# Patient Record
Sex: Male | Born: 1960
Health system: Southern US, Community
[De-identification: ages and names within clinical notes are randomized; demographics above are authoritative.]

## PROBLEM LIST (undated history)

## (undated) DIAGNOSIS — L409 Psoriasis, unspecified: Secondary | ICD-10-CM

## (undated) DIAGNOSIS — Z87442 Personal history of urinary calculi: Secondary | ICD-10-CM

## (undated) DIAGNOSIS — N189 Chronic kidney disease, unspecified: Secondary | ICD-10-CM

## (undated) DIAGNOSIS — F419 Anxiety disorder, unspecified: Secondary | ICD-10-CM

## (undated) DIAGNOSIS — C801 Malignant (primary) neoplasm, unspecified: Secondary | ICD-10-CM

## (undated) DIAGNOSIS — E039 Hypothyroidism, unspecified: Secondary | ICD-10-CM

## (undated) DIAGNOSIS — M1990 Unspecified osteoarthritis, unspecified site: Secondary | ICD-10-CM

## (undated) DIAGNOSIS — L4050 Arthropathic psoriasis, unspecified: Secondary | ICD-10-CM

## (undated) DIAGNOSIS — F32A Depression, unspecified: Secondary | ICD-10-CM

## (undated) DIAGNOSIS — I10 Essential (primary) hypertension: Secondary | ICD-10-CM

## (undated) DIAGNOSIS — K219 Gastro-esophageal reflux disease without esophagitis: Secondary | ICD-10-CM

## (undated) HISTORY — PX: POLYPECTOMY: SHX149

## (undated) HISTORY — DX: Chronic kidney disease, unspecified: N18.9

---

## 1989-10-04 HISTORY — PX: HEMORRHOID SURGERY: SHX153

## 2020-03-06 ENCOUNTER — Other Ambulatory Visit: Payer: Self-pay | Admitting: Urology

## 2020-03-06 DIAGNOSIS — D49512 Neoplasm of unspecified behavior of left kidney: Secondary | ICD-10-CM

## 2020-04-21 ENCOUNTER — Other Ambulatory Visit: Payer: Self-pay

## 2020-04-21 ENCOUNTER — Ambulatory Visit
Admission: RE | Admit: 2020-04-21 | Discharge: 2020-04-21 | Disposition: A | Discharge status: Home / Self Care | Payer: Managed Care, Other (non HMO) | Source: Ambulatory Visit | Attending: Urology | Admitting: Urology

## 2020-04-21 DIAGNOSIS — D49512 Neoplasm of unspecified behavior of left kidney: Secondary | ICD-10-CM

## 2020-04-21 MED ORDER — GADOBENATE DIMEGLUMINE 529 MG/ML IV SOLN
14.0000 mL | Freq: Once | INTRAVENOUS | Status: AC | PRN
  Administered 2020-04-21: 14 mL via INTRAVENOUS

## 2020-05-05 ENCOUNTER — Other Ambulatory Visit: Payer: Self-pay | Admitting: Urology

## 2020-06-04 DIAGNOSIS — J45909 Unspecified asthma, uncomplicated: Secondary | ICD-10-CM

## 2020-06-04 HISTORY — DX: Unspecified asthma, uncomplicated: J45.909

## 2020-06-06 NOTE — Patient Instructions (Addendum)
DUE TO COVID-19 ONLY ONE VISITOR IS ALLOWED TO COME WITH YOU AND STAY IN THE WAITING ROOM ONLY DURING PRE OP AND PROCEDURE DAY OF SURGERY. THE 1 VISITOR  MAY VISIT WITH YOU AFTER SURGERY IN YOUR PRIVATE ROOM DURING VISITING HOURS ONLY!  YOU NEED TO HAVE A COVID 19 TEST ON_9/14______ @_1 :30______, THIS TEST MUST BE DONE BEFORE SURGERY,  COVID TESTING SITE [place] [address], IT IS ON THE RIGHT GOING OUT [address] APPROXIMATELY  2 MINUTES PAST [employer] ON THE RIGHT. ONCE YOUR COVID TEST IS COMPLETED,  PLEASE BEGIN THE QUARANTINE INSTRUCTIONS AS OUTLINED IN YOUR HANDOUT.                Bruce Douglas   Your procedure is scheduled on: 06/20/20   Report to [facility] Main  Entrance   Report to admitting at  11:00 AM     Call this number if you have problems the morning of surgery [phone]    Remember: Do not eat food or drink liquids :After Midnight  . BRUSH YOUR TEETH MORNING OF SURGERY AND RINSE YOUR MOUTH OUT, NO CHEWING GUM CANDY OR MINTS.     Take these medicines the morning of surgery with A SIP OF WATER: Synthroid, Prilosec                                 You may not have any metal on your body including              piercings  Do not wear jewelry, lotions, powders or deodorant                      Men may shave face and neck.   Do not bring valuables to the hospital. [place].  Contacts, dentures or bridgework may not be worn into surgery.                  Please read over the following fact sheets you were given: _____________________________________________________________________             [facility] - Preparing for Surgery Before surgery, you can play an important role.   Because skin is not sterile, your skin needs to be as free of germs as possible .  You can reduce the number of germs on your skin by washing with CHG (chlorahexidine gluconate) soap before  surgery.   CHG is an antiseptic cleaner which kills germs and bonds with the skin to continue killing germs even after washing. Please DO NOT use if you have an allergy to CHG or antibacterial soaps.   If your skin becomes reddened/irritated stop using the CHG and inform your nurse when you arrive at Short Stay.   You may shave your face/neck.  Please follow these instructions carefully:  1.  Shower with CHG Soap the night before surgery and the  morning of Surgery.  2.  If you choose to wash your hair, wash your hair first as usual with your  normal  shampoo.  3.  After you shampoo, rinse your hair and body thoroughly to remove the  shampoo.  4.  Use CHG as you would any other liquid soap.  You can apply chg directly  to the skin and wash                       Gently with a scrungie or clean washcloth.  5.  Apply the CHG Soap to your body ONLY FROM THE NECK DOWN.   Do not use on face/ open                           Wound or open sores. Avoid contact with eyes, ears mouth and genitals (private parts).                       Wash face,  Genitals (private parts) with your normal soap.             6.  Wash thoroughly, paying special attention to the area where your surgery  will be performed.  7.  Thoroughly rinse your body with warm water from the neck down.  8.  DO NOT shower/wash with your normal soap after using and rinsing off  the CHG Soap.             9.  Pat yourself dry with a clean towel.            10.  Wear clean pajamas.            11.  Place clean sheets on your bed the night of your first shower and do not  sleep with pets. Day of Surgery : Do not apply any lotions/deodorants the morning of surgery.  Please wear clean clothes to the hospital/surgery center.  FAILURE TO FOLLOW THESE INSTRUCTIONS MAY RESULT IN THE CANCELLATION OF YOUR SURGERY PATIENT SIGNATURE_________________________________  NURSE  SIGNATURE__________________________________  ________________________________________________________________________   Bruce Douglas  An incentive spirometer is a tool that can help keep your lungs clear and active. This tool measures how well you are filling your lungs with each breath. Taking long deep breaths may help reverse or decrease the chance of developing breathing (pulmonary) problems (especially infection) following:  A long period of time when you are unable to move or be active. BEFORE THE PROCEDURE   If the spirometer includes an indicator to show your best effort, your nurse or respiratory therapist will set it to a desired goal.  If possible, sit up straight or lean slightly forward. Try not to slouch.  Hold the incentive spirometer in an upright position. INSTRUCTIONS FOR USE  1. Sit on the edge of your bed if possible, or sit up as far as you can in bed or on a chair. 2. Hold the incentive spirometer in an upright position. 3. Breathe out normally. 4. Place the mouthpiece in your mouth and seal your lips tightly around it. 5. Breathe in slowly and as deeply as possible, raising the piston or the ball toward the top of the column. 6. Hold your breath for 3-5 seconds or for as long as possible. Allow the piston or ball to fall to the bottom of the column. 7. Remove the mouthpiece from your mouth and breathe out normally. 8. Rest for a few seconds and repeat Steps 1 through 7 at least 10 times every 1-2 hours when you are awake. Take your time and take a few normal breaths between deep breaths. 9. The spirometer may include an indicator to show your best effort.  Use the indicator as a goal to work toward during each repetition. 10. After each set of 10 deep breaths, practice coughing to be sure your lungs are clear. If you have an incision (the cut made at the time of surgery), support your incision when coughing by placing a pillow or rolled up towels firmly  against it. Once you are able to get out of bed, walk around indoors and cough well. You may stop using the incentive spirometer when instructed by your caregiver.  RISKS AND COMPLICATIONS  Take your time so you do not get dizzy or light-headed.  If you are in pain, you may need to take or ask for pain medication before doing incentive spirometry. It is harder to take a deep breath if you are having pain. AFTER USE  Rest and breathe slowly and easily.  It can be helpful to keep track of a log of your progress. Your caregiver can provide you with a simple table to help with this. If you are using the spirometer at home, follow these instructions: [place] IF:   You are having difficultly using the spirometer.  You have trouble using the spirometer as often as instructed.  Your pain medication is not giving enough relief while using the spirometer.  You develop fever of 100.5 F (38.1 C) or higher. SEEK IMMEDIATE MEDICAL CARE IF:   You cough up bloody sputum that had not been present before.  You develop fever of 102 F (38.9 C) or greater.  You develop worsening pain at or near the incision site. MAKE SURE YOU:   Understand these instructions.  Will watch your condition.  Will get help right away if you are not doing well or get worse. Document Released: 01/31/2007 Document Revised: 12/13/2011 Document Reviewed: 04/03/2007 [facility] Patient Information 2014 [place], [place].   ________________________________________________________________________

## 2020-06-10 ENCOUNTER — Encounter (HOSPITAL_COMMUNITY): Payer: Self-pay

## 2020-06-10 ENCOUNTER — Other Ambulatory Visit: Payer: Self-pay

## 2020-06-10 ENCOUNTER — Encounter (HOSPITAL_COMMUNITY)
Admission: RE | Admit: 2020-06-10 | Discharge: 2020-06-10 | Disposition: A | Discharge status: Home / Self Care | Payer: 59 | Source: Ambulatory Visit | Attending: Urology | Admitting: Urology

## 2020-06-10 DIAGNOSIS — Z01818 Encounter for other preprocedural examination: Secondary | ICD-10-CM | POA: Insufficient documentation

## 2020-06-10 HISTORY — DX: Anxiety disorder, unspecified: F41.9

## 2020-06-10 HISTORY — DX: Malignant (primary) neoplasm, unspecified: C80.1

## 2020-06-10 HISTORY — DX: Personal history of urinary calculi: Z87.442

## 2020-06-10 HISTORY — DX: Essential (primary) hypertension: I10

## 2020-06-10 HISTORY — DX: Hypothyroidism, unspecified: E03.9

## 2020-06-10 HISTORY — DX: Unspecified osteoarthritis, unspecified site: M19.90

## 2020-06-10 HISTORY — DX: Depression, unspecified: F32.A

## 2020-06-10 HISTORY — DX: Gastro-esophageal reflux disease without esophagitis: K21.9

## 2020-06-10 LAB — COMPREHENSIVE METABOLIC PANEL
ALT: 13 U/L (ref 0–44)
AST: 18 U/L (ref 15–41)
Albumin: 4.1 g/dL (ref 3.5–5.0)
Alkaline Phosphatase: 47 U/L (ref 38–126)
Anion gap: 7 (ref 5–15)
BUN: 22 mg/dL — ABNORMAL HIGH (ref 6–20)
CO2: 25 mmol/L (ref 22–32)
Calcium: 9.4 mg/dL (ref 8.9–10.3)
Chloride: 105 mmol/L (ref 98–111)
Creatinine, Ser: 1.05 mg/dL (ref 0.61–1.24)
GFR calc Af Amer: >60 mL/min (ref >60)
GFR calc non Af Amer: >60 mL/min (ref >60)
Glucose, Bld: 92 mg/dL (ref 70–99)
Potassium: 4.6 mmol/L (ref 3.5–5.1)
Sodium: 137 mmol/L (ref 135–145)
Total Bilirubin: 0.5 mg/dL (ref 0.3–1.2)
Total Protein: 7.5 g/dL (ref 6.5–8.1)

## 2020-06-10 LAB — CBC
HCT: 39.5 % (ref 39.0–52.0)
Hemoglobin: 13.3 g/dL (ref 13.0–17.0)
MCH: 32.6 pg (ref 26.0–34.0)
MCHC: 33.7 g/dL (ref 30.0–36.0)
MCV: 96.8 fL (ref 80.0–100.0)
Platelets: 249 10*3/uL (ref 150–400)
RBC: 4.08 MIL/uL — ABNORMAL LOW (ref 4.22–5.81)
RDW: 12.4 % (ref 11.5–15.5)
WBC: 7.6 10*3/uL (ref 4.0–10.5)
nRBC: 0 % (ref 0.0–0.2)

## 2020-06-10 NOTE — Progress Notes (Signed)
COVID Vaccine Completed:Yes Date COVID Vaccine completed:01/02/20 COVID vaccine manufacturer: Pfizer     PCP - Dr. Margret Chance Cardiologist - no  Chest x-ray - 06/04/20 MRI done EKG - 06/10/20 Stress Test - no ECHO - no Cardiac Cath - no  Sleep Study - yes waiting for results CPAP -   Fasting Blood Sugar - NA Checks Blood Sugar _____ times a day  Blood Thinner Instructions:NA Aspirin Instructions: Last Dose:  Anesthesia review:   Patient denies shortness of breath, fever, cough and chest pain at PAT appointment yes   Patient verbalized understanding of instructions that were given to them at the PAT appointment. Patient was also instructed that they will need to review over the PAT instructions again at home before surgery. Yes  Pt reports no SOB  He has a new diagnosis of Asthma and" they saw something on the x-ray so they did an MRI" He has no SOB climbing stairs while working or with ADLs

## 2020-06-17 ENCOUNTER — Other Ambulatory Visit (HOSPITAL_COMMUNITY)
Admission: RE | Admit: 2020-06-17 | Discharge: 2020-06-17 | Disposition: A | Discharge status: Home / Self Care | Payer: 59 | Source: Ambulatory Visit | Attending: Urology | Admitting: Urology

## 2020-06-17 DIAGNOSIS — Z01812 Encounter for preprocedural laboratory examination: Secondary | ICD-10-CM | POA: Insufficient documentation

## 2020-06-17 DIAGNOSIS — Z20822 Contact with and (suspected) exposure to covid-19: Secondary | ICD-10-CM | POA: Insufficient documentation

## 2020-06-18 LAB — SARS CORONAVIRUS 2 (TAT 6-24 HRS): SARS Coronavirus 2: NEGATIVE

## 2020-06-20 ENCOUNTER — Inpatient Hospital Stay (HOSPITAL_COMMUNITY): Payer: 59 | Admitting: Physician Assistant

## 2020-06-20 ENCOUNTER — Inpatient Hospital Stay (HOSPITAL_COMMUNITY)
Admission: RE | Admit: 2020-06-20 | Discharge: 2020-06-24 | DRG: 657 | Disposition: A | Discharge status: Home / Self Care | Payer: 59 | Attending: Urology | Admitting: Urology

## 2020-06-20 ENCOUNTER — Inpatient Hospital Stay (HOSPITAL_COMMUNITY): Payer: 59 | Admitting: Anesthesiology

## 2020-06-20 ENCOUNTER — Encounter (HOSPITAL_COMMUNITY): Payer: Self-pay | Admitting: Urology

## 2020-06-20 ENCOUNTER — Other Ambulatory Visit: Payer: Self-pay

## 2020-06-20 ENCOUNTER — Encounter (HOSPITAL_COMMUNITY)
Admission: RE | Disposition: A | Discharge status: Home / Self Care | Payer: Self-pay | Source: Ambulatory Visit | Attending: Urology

## 2020-06-20 DIAGNOSIS — I10 Essential (primary) hypertension: Secondary | ICD-10-CM | POA: Diagnosis present

## 2020-06-20 DIAGNOSIS — N2889 Other specified disorders of kidney and ureter: Secondary | ICD-10-CM | POA: Diagnosis present

## 2020-06-20 DIAGNOSIS — K219 Gastro-esophageal reflux disease without esophagitis: Secondary | ICD-10-CM | POA: Diagnosis present

## 2020-06-20 DIAGNOSIS — Y838 Other surgical procedures as the cause of abnormal reaction of the patient, or of later complication, without mention of misadventure at the time of the procedure: Secondary | ICD-10-CM | POA: Diagnosis not present

## 2020-06-20 DIAGNOSIS — M1990 Unspecified osteoarthritis, unspecified site: Secondary | ICD-10-CM | POA: Diagnosis present

## 2020-06-20 DIAGNOSIS — J449 Chronic obstructive pulmonary disease, unspecified: Secondary | ICD-10-CM | POA: Diagnosis present

## 2020-06-20 DIAGNOSIS — Z20822 Contact with and (suspected) exposure to covid-19: Secondary | ICD-10-CM | POA: Diagnosis present

## 2020-06-20 DIAGNOSIS — F419 Anxiety disorder, unspecified: Secondary | ICD-10-CM | POA: Diagnosis present

## 2020-06-20 DIAGNOSIS — E039 Hypothyroidism, unspecified: Secondary | ICD-10-CM | POA: Diagnosis present

## 2020-06-20 DIAGNOSIS — R1110 Vomiting, unspecified: Secondary | ICD-10-CM

## 2020-06-20 DIAGNOSIS — F17210 Nicotine dependence, cigarettes, uncomplicated: Secondary | ICD-10-CM | POA: Diagnosis present

## 2020-06-20 DIAGNOSIS — K567 Ileus, unspecified: Secondary | ICD-10-CM | POA: Diagnosis not present

## 2020-06-20 DIAGNOSIS — R350 Frequency of micturition: Secondary | ICD-10-CM | POA: Diagnosis present

## 2020-06-20 DIAGNOSIS — K9189 Other postprocedural complications and disorders of digestive system: Secondary | ICD-10-CM | POA: Diagnosis not present

## 2020-06-20 DIAGNOSIS — C642 Malignant neoplasm of left kidney, except renal pelvis: Secondary | ICD-10-CM | POA: Diagnosis present

## 2020-06-20 DIAGNOSIS — F329 Major depressive disorder, single episode, unspecified: Secondary | ICD-10-CM | POA: Diagnosis present

## 2020-06-20 HISTORY — PX: ROBOT ASSISTED LAPAROSCOPIC NEPHRECTOMY: SHX5140

## 2020-06-20 LAB — TYPE AND SCREEN
ABO/RH(D): A NEG
Antibody Screen: NEGATIVE

## 2020-06-20 LAB — HEMOGLOBIN AND HEMATOCRIT, BLOOD
HCT: 38 % — ABNORMAL LOW (ref 39.0–52.0)
Hemoglobin: 13.1 g/dL (ref 13.0–17.0)

## 2020-06-20 LAB — ABO/RH: ABO/RH(D): A NEG

## 2020-06-20 SURGERY — NEPHRECTOMY, RADICAL, ROBOT-ASSISTED, LAPAROSCOPIC, ADULT
Anesthesia: General | Site: Abdomen | Laterality: Left

## 2020-06-20 MED ORDER — ORAL CARE MOUTH RINSE: 15.0000 mL | Freq: Once | OROMUCOSAL | Status: AC

## 2020-06-20 MED ORDER — BELLADONNA ALKALOIDS-OPIUM 16.2-60 MG RE SUPP
RECTAL | Status: AC
  Administered 2020-06-20: 1 via RECTAL
  Filled 2020-06-20: qty 1

## 2020-06-20 MED ORDER — HYDROCODONE-ACETAMINOPHEN 5-325 MG PO TABS: 1.0000 | ORAL_TABLET | Freq: Four times a day (QID) | ORAL | 0 refills | Status: DC | PRN

## 2020-06-20 MED ORDER — LIDOCAINE HCL (CARDIAC) PF 100 MG/5ML IV SOSY
PREFILLED_SYRINGE | INTRAVENOUS | Status: DC | PRN
  Administered 2020-06-20: 40 mg via INTRAVENOUS

## 2020-06-20 MED ORDER — HYDROMORPHONE HCL 1 MG/ML IJ SOLN
INTRAMUSCULAR | Status: AC
  Administered 2020-06-20: 0.5 mg via INTRAVENOUS
  Filled 2020-06-20: qty 1

## 2020-06-20 MED ORDER — ROCURONIUM BROMIDE 10 MG/ML (PF) SYRINGE
PREFILLED_SYRINGE | INTRAVENOUS | Status: DC | PRN
  Administered 2020-06-20: 50 mg via INTRAVENOUS
  Administered 2020-06-20: 30 mg via INTRAVENOUS

## 2020-06-20 MED ORDER — CEFAZOLIN SODIUM-DEXTROSE 1-4 GM/50ML-% IV SOLN
1.0000 g | Freq: Three times a day (TID) | INTRAVENOUS | Status: AC
  Administered 2020-06-20 – 2020-06-21 (×2): 1 g via INTRAVENOUS
  Filled 2020-06-20 (×2): qty 50

## 2020-06-20 MED ORDER — SUGAMMADEX SODIUM 200 MG/2ML IV SOLN
INTRAVENOUS | Status: DC | PRN
  Administered 2020-06-20: 140 mg via INTRAVENOUS

## 2020-06-20 MED ORDER — FENTANYL CITRATE (PF) 100 MCG/2ML IJ SOLN
INTRAMUSCULAR | Status: AC
  Administered 2020-06-20: 50 ug via INTRAVENOUS
  Filled 2020-06-20: qty 2

## 2020-06-20 MED ORDER — PROPOFOL 10 MG/ML IV BOLUS
INTRAVENOUS | Status: AC
  Filled 2020-06-20: qty 20

## 2020-06-20 MED ORDER — LIDOCAINE 2% (20 MG/ML) 5 ML SYRINGE
INTRAMUSCULAR | Status: AC
  Filled 2020-06-20: qty 5

## 2020-06-20 MED ORDER — BACITRACIN-NEOMYCIN-POLYMYXIN 400-5-5000 EX OINT: 1.0000 "application " | TOPICAL_OINTMENT | Freq: Three times a day (TID) | CUTANEOUS | Status: DC | PRN

## 2020-06-20 MED ORDER — DEXTROSE-NACL 5-0.45 % IV SOLN: INTRAVENOUS | Status: DC

## 2020-06-20 MED ORDER — PROPOFOL 10 MG/ML IV BOLUS
INTRAVENOUS | Status: DC | PRN
  Administered 2020-06-20: 120 mg via INTRAVENOUS

## 2020-06-20 MED ORDER — DOCUSATE SODIUM 100 MG PO CAPS
100.0000 mg | ORAL_CAPSULE | Freq: Two times a day (BID) | ORAL | Status: DC
  Administered 2020-06-20 – 2020-06-23 (×3): 100 mg via ORAL
  Filled 2020-06-20 (×7): qty 1

## 2020-06-20 MED ORDER — BUPIVACAINE-EPINEPHRINE (PF) 0.25% -1:200000 IJ SOLN
INTRAMUSCULAR | Status: AC
  Filled 2020-06-20: qty 30

## 2020-06-20 MED ORDER — PROMETHAZINE HCL 25 MG/ML IJ SOLN: 6.2500 mg | INTRAMUSCULAR | Status: DC | PRN

## 2020-06-20 MED ORDER — BELLADONNA ALKALOIDS-OPIUM 16.2-60 MG RE SUPP: 1.0000 | Freq: Four times a day (QID) | RECTAL | Status: DC | PRN

## 2020-06-20 MED ORDER — PHENYLEPHRINE 40 MCG/ML (10ML) SYRINGE FOR IV PUSH (FOR BLOOD PRESSURE SUPPORT)
PREFILLED_SYRINGE | INTRAVENOUS | Status: DC | PRN
  Administered 2020-06-20 (×6): 80 ug via INTRAVENOUS

## 2020-06-20 MED ORDER — FENTANYL CITRATE (PF) 250 MCG/5ML IJ SOLN
INTRAMUSCULAR | Status: DC | PRN
Start: 2020-06-20 — End: 2020-06-20
  Administered 2020-06-20: 250 ug via INTRAVENOUS
  Administered 2020-06-20 (×2): 50 ug via INTRAVENOUS
  Administered 2020-06-20: 100 ug via INTRAVENOUS

## 2020-06-20 MED ORDER — FENTANYL CITRATE (PF) 100 MCG/2ML IJ SOLN
INTRAMUSCULAR | Status: AC
  Filled 2020-06-20: qty 2

## 2020-06-20 MED ORDER — VARENICLINE TARTRATE 1 MG PO TABS
1.0000 mg | ORAL_TABLET | Freq: Two times a day (BID) | ORAL | Status: DC
  Administered 2020-06-20 – 2020-06-21 (×2): 1 mg via ORAL
  Filled 2020-06-20 (×3): qty 2
  Filled 2020-06-20 (×2): qty 1
  Filled 2020-06-20: qty 2
  Filled 2020-06-20 (×2): qty 1

## 2020-06-20 MED ORDER — PHENYLEPHRINE 40 MCG/ML (10ML) SYRINGE FOR IV PUSH (FOR BLOOD PRESSURE SUPPORT)
PREFILLED_SYRINGE | INTRAVENOUS | Status: AC
  Filled 2020-06-20: qty 10

## 2020-06-20 MED ORDER — CEFAZOLIN SODIUM-DEXTROSE 2-4 GM/100ML-% IV SOLN
2.0000 g | Freq: Once | INTRAVENOUS | Status: AC
  Administered 2020-06-20: 2 g via INTRAVENOUS
  Filled 2020-06-20: qty 100

## 2020-06-20 MED ORDER — BUPROPION HCL ER (XL) 300 MG PO TB24
300.0000 mg | ORAL_TABLET | Freq: Every day | ORAL | Status: DC
  Administered 2020-06-20 – 2020-06-23 (×3): 300 mg via ORAL
  Filled 2020-06-20 (×4): qty 1

## 2020-06-20 MED ORDER — ACETAMINOPHEN 500 MG PO TABS
1000.0000 mg | ORAL_TABLET | Freq: Once | ORAL | Status: AC
  Administered 2020-06-20: 1000 mg via ORAL
  Filled 2020-06-20: qty 2

## 2020-06-20 MED ORDER — AMLODIPINE BESYLATE 5 MG PO TABS
5.0000 mg | ORAL_TABLET | Freq: Every day | ORAL | Status: DC
  Administered 2020-06-20 – 2020-06-21 (×2): 5 mg via ORAL
  Filled 2020-06-20 (×3): qty 1

## 2020-06-20 MED ORDER — BUPIVACAINE-EPINEPHRINE 0.25% -1:200000 IJ SOLN
INTRAMUSCULAR | Status: DC | PRN
  Administered 2020-06-20: 30 mL

## 2020-06-20 MED ORDER — DOCUSATE SODIUM 100 MG PO CAPS: 100.0000 mg | ORAL_CAPSULE | Freq: Two times a day (BID) | ORAL | Status: DC

## 2020-06-20 MED ORDER — LEVOTHYROXINE SODIUM 137 MCG PO TABS
137.0000 ug | ORAL_TABLET | Freq: Every day | ORAL | Status: DC
  Administered 2020-06-21 – 2020-06-22 (×2): 137 ug via ORAL
  Filled 2020-06-20 (×3): qty 1

## 2020-06-20 MED ORDER — CHLORHEXIDINE GLUCONATE 0.12 % MT SOLN
15.0000 mL | Freq: Once | OROMUCOSAL | Status: AC
  Administered 2020-06-20: 15 mL via OROMUCOSAL

## 2020-06-20 MED ORDER — PANTOPRAZOLE SODIUM 40 MG PO TBEC
40.0000 mg | DELAYED_RELEASE_TABLET | Freq: Every day | ORAL | Status: DC
  Administered 2020-06-20 – 2020-06-21 (×2): 40 mg via ORAL
  Filled 2020-06-20 (×4): qty 1

## 2020-06-20 MED ORDER — FENTANYL CITRATE (PF) 250 MCG/5ML IJ SOLN
INTRAMUSCULAR | Status: AC
  Filled 2020-06-20: qty 5

## 2020-06-20 MED ORDER — HYDROMORPHONE HCL 1 MG/ML IJ SOLN
0.2500 mg | INTRAMUSCULAR | Status: DC | PRN
  Administered 2020-06-20: 0.5 mg via INTRAVENOUS

## 2020-06-20 MED ORDER — QUETIAPINE FUMARATE 50 MG PO TABS
50.0000 mg | ORAL_TABLET | Freq: Every day | ORAL | Status: DC
  Administered 2020-06-20 – 2020-06-23 (×3): 50 mg via ORAL
  Filled 2020-06-20 (×2): qty 1
  Filled 2020-06-20: qty 2
  Filled 2020-06-20 (×3): qty 1
  Filled 2020-06-20 (×2): qty 2

## 2020-06-20 MED ORDER — PROMETHAZINE HCL 12.5 MG PO TABS: 12.5000 mg | ORAL_TABLET | ORAL | 0 refills | Status: DC | PRN

## 2020-06-20 MED ORDER — FENTANYL CITRATE (PF) 100 MCG/2ML IJ SOLN: 25.0000 ug | INTRAMUSCULAR | Status: DC | PRN

## 2020-06-20 MED ORDER — LACTATED RINGERS IV SOLN: INTRAVENOUS | Status: DC

## 2020-06-20 MED ORDER — BUPIVACAINE LIPOSOME 1.3 % IJ SUSP
20.0000 mL | Freq: Once | INTRAMUSCULAR | Status: AC
  Administered 2020-06-20: 20 mL
  Filled 2020-06-20: qty 20

## 2020-06-20 MED ORDER — LACTATED RINGERS IR SOLN
Status: DC | PRN
  Administered 2020-06-20: 1000 mL

## 2020-06-20 MED ORDER — ROCURONIUM BROMIDE 10 MG/ML (PF) SYRINGE
PREFILLED_SYRINGE | INTRAVENOUS | Status: AC
  Filled 2020-06-20: qty 10

## 2020-06-20 MED ORDER — DIPHENHYDRAMINE HCL 50 MG/ML IJ SOLN: 12.5000 mg | Freq: Four times a day (QID) | INTRAMUSCULAR | Status: DC | PRN

## 2020-06-20 MED ORDER — DEXMEDETOMIDINE HCL IN NACL 200 MCG/50ML IV SOLN
INTRAVENOUS | Status: DC | PRN
  Administered 2020-06-20: 20 ug via INTRAVENOUS

## 2020-06-20 MED ORDER — DIPHENHYDRAMINE HCL 12.5 MG/5ML PO ELIX: 12.5000 mg | ORAL_SOLUTION | Freq: Four times a day (QID) | ORAL | Status: DC | PRN

## 2020-06-20 MED ORDER — HYDROMORPHONE HCL 1 MG/ML IJ SOLN
0.5000 mg | INTRAMUSCULAR | Status: DC | PRN
  Administered 2020-06-20 – 2020-06-22 (×8): 1 mg via INTRAVENOUS
  Administered 2020-06-23: 0.5 mg via INTRAVENOUS
  Administered 2020-06-23: 1 mg via INTRAVENOUS
  Administered 2020-06-23 (×2): 0.5 mg via INTRAVENOUS
  Administered 2020-06-24: 1 mg via INTRAVENOUS
  Filled 2020-06-20 (×13): qty 1

## 2020-06-20 MED ORDER — SODIUM CHLORIDE (PF) 0.9 % IJ SOLN
INTRAMUSCULAR | Status: AC
  Filled 2020-06-20: qty 20

## 2020-06-20 MED ORDER — MIDAZOLAM HCL 2 MG/2ML IJ SOLN
INTRAMUSCULAR | Status: AC
  Filled 2020-06-20: qty 2

## 2020-06-20 MED ORDER — PROMETHAZINE HCL 25 MG/ML IJ SOLN
25.0000 mg | INTRAMUSCULAR | Status: DC | PRN
  Administered 2020-06-20: 25 mg via INTRAVENOUS
  Filled 2020-06-20: qty 1

## 2020-06-20 MED ORDER — ATENOLOL 25 MG PO TABS
25.0000 mg | ORAL_TABLET | Freq: Every day | ORAL | Status: DC
  Administered 2020-06-20 – 2020-06-21 (×2): 25 mg via ORAL
  Filled 2020-06-20 (×3): qty 1

## 2020-06-20 MED ORDER — ONDANSETRON HCL 4 MG/2ML IJ SOLN
INTRAMUSCULAR | Status: DC | PRN
  Administered 2020-06-20: 4 mg via INTRAVENOUS

## 2020-06-20 MED ORDER — DEXMEDETOMIDINE HCL IN NACL 200 MCG/50ML IV SOLN
INTRAVENOUS | Status: AC
  Filled 2020-06-20: qty 50

## 2020-06-20 MED ORDER — MIDAZOLAM HCL 2 MG/2ML IJ SOLN
INTRAMUSCULAR | Status: DC | PRN
  Administered 2020-06-20: 2 mg via INTRAVENOUS

## 2020-06-20 MED ORDER — DEXAMETHASONE SODIUM PHOSPHATE 10 MG/ML IJ SOLN
INTRAMUSCULAR | Status: DC | PRN
  Administered 2020-06-20: 5 mg via INTRAVENOUS

## 2020-06-20 MED ORDER — STERILE WATER FOR IRRIGATION IR SOLN
Status: DC | PRN
  Administered 2020-06-20: 1000 mL

## 2020-06-20 MED ORDER — OXYCODONE HCL 5 MG PO TABS
5.0000 mg | ORAL_TABLET | ORAL | Status: DC | PRN
  Administered 2020-06-20 – 2020-06-24 (×7): 5 mg via ORAL
  Filled 2020-06-20 (×8): qty 1

## 2020-06-20 MED ORDER — ACETAMINOPHEN 325 MG PO TABS: 650.0000 mg | ORAL_TABLET | ORAL | Status: DC | PRN

## 2020-06-20 MED ORDER — ONDANSETRON HCL 4 MG/2ML IJ SOLN
4.0000 mg | INTRAMUSCULAR | Status: DC | PRN
  Administered 2020-06-20 – 2020-06-23 (×3): 4 mg via INTRAVENOUS
  Filled 2020-06-20 (×3): qty 2

## 2020-06-20 MED ORDER — DOXAZOSIN MESYLATE 4 MG PO TABS
4.0000 mg | ORAL_TABLET | Freq: Every day | ORAL | Status: DC
  Administered 2020-06-20 – 2020-06-21 (×3): 4 mg via ORAL
  Filled 2020-06-20 (×2): qty 1
  Filled 2020-06-20: qty 2
  Filled 2020-06-20: qty 1
  Filled 2020-06-20 (×2): qty 2

## 2020-06-20 MED FILL — PROMETHAZINE 12.5 MG TABLET: 12.5 | 2 days supply | Qty: 15 | Fill #0

## 2020-06-20 MED FILL — HYDROCODON-APAP 5-325: 5-325 | 3 days supply | Qty: 20 | Fill #0

## 2020-06-20 SURGICAL SUPPLY — 55 items
ADH SKN CLS APL DERMABOND .7 (GAUZE/BANDAGES/DRESSINGS) ×1
APL PRP STRL LF DISP 70% ISPRP (MISCELLANEOUS) ×1
BAG LAPAROSCOPIC 12 15 PORT 16 (BASKET) ×1 IMPLANT
BAG RETRIEVAL 12/15 (BASKET) ×2
BAG RETRIEVAL 12/15MM (BASKET) ×1
CHLORAPREP W/TINT 26 (MISCELLANEOUS) ×3 IMPLANT
CLIP VESOLOCK LG 6/CT PURPLE (CLIP) ×3 IMPLANT
CLIP VESOLOCK MED LG 6/CT (CLIP) ×3 IMPLANT
CLIP VESOLOCK XL 6/CT (CLIP) ×3 IMPLANT
COVER SURGICAL LIGHT HANDLE (MISCELLANEOUS) ×3 IMPLANT
COVER TIP SHEARS 8 DVNC (MISCELLANEOUS) ×1 IMPLANT
COVER TIP SHEARS 8MM DA VINCI (MISCELLANEOUS) ×2
COVER WAND RF STERILE (DRAPES) IMPLANT
CUTTER ECHEON FLEX ENDO 45 340 (ENDOMECHANICALS) IMPLANT
DECANTER SPIKE VIAL GLASS SM (MISCELLANEOUS) ×3 IMPLANT
DERMABOND ADVANCED (GAUZE/BANDAGES/DRESSINGS) ×2
DERMABOND ADVANCED .7 DNX12 (GAUZE/BANDAGES/DRESSINGS) ×1 IMPLANT
DRAPE ARM DVNC X/XI (DISPOSABLE) ×4 IMPLANT
DRAPE COLUMN DVNC XI (DISPOSABLE) ×1 IMPLANT
DRAPE DA VINCI XI ARM (DISPOSABLE) ×8
DRAPE DA VINCI XI COLUMN (DISPOSABLE) ×2
DRAPE INCISE IOBAN 66X45 STRL (DRAPES) ×3 IMPLANT
DRAPE SHEET LG 3/4 BI-LAMINATE (DRAPES) ×3 IMPLANT
ELECT REM PT RETURN 15FT ADLT (MISCELLANEOUS) ×3 IMPLANT
GLOVE BIO SURGEON STRL SZ 6.5 (GLOVE) ×2 IMPLANT
GLOVE BIO SURGEONS STRL SZ 6.5 (GLOVE) ×1
GLOVE BIOGEL M STRL SZ7.5 (GLOVE) ×6 IMPLANT
GOWN STRL REUS W/TWL LRG LVL3 (GOWN DISPOSABLE) ×3 IMPLANT
GOWN STRL REUS W/TWL XL LVL3 (GOWN DISPOSABLE) ×6 IMPLANT
HEMOSTAT SURGICEL 4X8 (HEMOSTASIS) ×3 IMPLANT
IRRIG SUCT STRYKERFLOW 2 WTIP (MISCELLANEOUS) ×3
IRRIGATION SUCT STRKRFLW 2 WTP (MISCELLANEOUS) ×1 IMPLANT
KIT BASIN OR (CUSTOM PROCEDURE TRAY) ×3 IMPLANT
KIT TURNOVER KIT A (KITS) IMPLANT
NEEDLE INSUFFLATION 14GA 120MM (NEEDLE) ×3 IMPLANT
PENCIL SMOKE EVACUATOR (MISCELLANEOUS) IMPLANT
PROTECTOR NERVE ULNAR (MISCELLANEOUS) ×3 IMPLANT
SEAL CANN UNIV 5-8 DVNC XI (MISCELLANEOUS) ×3 IMPLANT
SEAL XI 5MM-8MM UNIVERSAL (MISCELLANEOUS) ×6
SET TUBE SMOKE EVAC HIGH FLOW (TUBING) ×3 IMPLANT
SOLUTION ELECTROLUBE (MISCELLANEOUS) ×3 IMPLANT
STAPLE RELOAD 45 WHT (STAPLE) ×2 IMPLANT
STAPLE RELOAD 45MM WHITE (STAPLE) ×6
SUT MNCRL AB 4-0 PS2 18 (SUTURE) ×6 IMPLANT
SUT PDS AB 0 CT1 36 (SUTURE) ×6 IMPLANT
SUT VIC AB 2-0 SH 27 (SUTURE) ×3
SUT VIC AB 2-0 SH 27X BRD (SUTURE) ×1 IMPLANT
SUT VICRYL 0 UR6 27IN ABS (SUTURE) ×3 IMPLANT
TOWEL OR 17X26 10 PK STRL BLUE (TOWEL DISPOSABLE) ×3 IMPLANT
TOWEL OR NON WOVEN STRL DISP B (DISPOSABLE) ×3 IMPLANT
TRAY FOLEY MTR SLVR 16FR STAT (SET/KITS/TRAYS/PACK) ×3 IMPLANT
TRAY LAPAROSCOPIC (CUSTOM PROCEDURE TRAY) ×3 IMPLANT
TROCAR BLADELESS OPT 5 100 (ENDOMECHANICALS) IMPLANT
TROCAR XCEL 12X100 BLDLESS (ENDOMECHANICALS) ×3 IMPLANT
WATER STERILE IRR 1000ML POUR (IV SOLUTION) ×3 IMPLANT

## 2020-06-20 NOTE — Discharge Instructions (Signed)

## 2020-06-20 NOTE — Op Note (Signed)
Operative Note  Preoperative diagnosis:  1.  Left renal mass   Postoperative diagnosis: 1.  Left renal mass  Procedure(s): 1.  Left robot assisted radical nephrectomy (adrenal sparing)  Surgeon: Ellison Hughs, MD  Assistants:  Debbrah Alar, [facility]  An assistant was required for this surgical procedure.  The duties of the assistant included but were not limited to suctioning, passing suture, camera manipulation, retraction.  This procedure would not be able to be performed without an Environmental consultant.   Anesthesia:  General  Complications:  None  EBL:  50 mL  Specimens: 1. Left kidney  Drains/Catheters: 1.  Foley catheter  Intraoperative findings:   1. The left renal hilum was hemostatic following staple ligation  Indication:  Bruce Douglas is a 59 y.o. male with a 2.1 cm endophytic, solid and cystic lesion involving the left kidney with features concerning for RCC on CT and MRI.  He has been consented for the above procedures, voices understanding and wishes to proceed.   Description of procedure:  After informed consent was obtained, the patient was brought to the operating room and general endotracheal anesthesia was administered.  The patient was then placed in the right lateral decubitus position and prepped and draped in usual sterile fashion.  A timeout was performed.  An 8 mm incision was then made lateral to the left rectus muscle at the level of the left 12th rib.  Abdominal access was obtained via a Veress needle.  The abdominal cavity was then insufflated up to 15 mmHg.  An 8 mm port was then introduced into the abdominal cavity.  Inspection of the port entry site by the robotic camera revealed no adjacent organ injury.  We then placed 2 additional 8 mm robotic ports to triangulate the left renal hilum.  A 12 mm assistant port was then placed between the carmera port and 3rd robotic arm.  The white line of Toldt along the descending colon was incised sharply and the colon,  along with its mesocolonic fat, was reflected medially until the aorta was identified.  We then made a small window adjacent to the lower pole of the left kidney, identifying the left psoas muscle, left ureter and left gonadal vein.  The left ureter and gonadal vein were then reflected anteriorly allowing [place] to then incised the perihilar attachments using electrocautery.  We encountered a small lumbar vein adjacent to the insertion of the left gonadal vein into the left renal vein.  This lumbar vein was ligated with hemo-lock clips in 2 places and incised sharply.  This provided [place] excellent exposure to the left renal hilum.    A 45 mm endovascular stapler was then used to ligate the left renal artery and then the left renal vein, achieving excellent hemostasis.  The remaining peri-renal attachments were then excised using a combination of blunt dissection and electrocautery.  The left adrenal gland was spared.  The endovascular stapler was then used to ligate the left gonadal vein and left ureter.  Once the kidney was freely mobile, it was placed in Endo Catch bag to be be retrieved at the conclusion of the case.  The robot was then de-docked.  A left lower quadrant Gibson incision was then made and the mass was removed within the Endo Catch bag.  The fascia within the midline assistant port was then closed using an interrupted 0 Vicryl suture.  The fascia of the internal and external oblique was then closed using a 0 PDS suture in a running fashion.  The subcutaneous tissue within the [facility] incision was then closed using a running 0 Vicryl suture.  All skin incisions were then closed using 4-0 Monocryl and then dressed with Dermabond.  The patient tolerated the procedure well and was transferred to the postanesthesia in stable condition.     Plan:  Monitor on the floor overnight.  Repeat labs and remove foley in the AM.  Advance diet as tolerated.

## 2020-06-20 NOTE — Anesthesia Postprocedure Evaluation (Signed)
Anesthesia Post Note  Patient: Bruce Douglas  Procedure(s) Performed: XI ROBOTIC ASSISTED LAPAROSCOPIC RADICAL NEPHRECTOMY (Left Abdomen)     Patient location during evaluation: PACU Anesthesia Type: General Level of consciousness: awake and alert, patient cooperative and oriented Pain management: pain level controlled (pain improving) Vital Signs Assessment: post-procedure vital signs reviewed and stable Respiratory status: spontaneous breathing, nonlabored ventilation and respiratory function stable Cardiovascular status: blood pressure returned to baseline and stable Postop Assessment: no apparent nausea or vomiting Anesthetic complications: no   No complications documented.  Last Vitals:  Vitals:   06/20/20 1545 06/20/20 1600  BP: 112/70 120/74  Pulse: 70 68  Resp: 11 12  Temp:    SpO2: 98% 95%    Last Pain:  Vitals:   06/20/20 1536  TempSrc:   PainSc: 6                  Camy Leder,E. Jadin Kagel

## 2020-06-20 NOTE — Anesthesia Procedure Notes (Signed)
Procedure Name: Intubation Date/Time: 06/20/2020 12:29 PM Performed by: Raenette Rover, CRNA Pre-anesthesia Checklist: Patient identified, Emergency Drugs available, Suction available and Patient being monitored Patient Re-evaluated:Patient Re-evaluated prior to induction Oxygen Delivery Method: Circle system utilized Preoxygenation: Pre-oxygenation with 100% oxygen Induction Type: IV induction Ventilation: Mask ventilation without difficulty Laryngoscope Size: Mac and 4 Grade View: Grade I Tube type: Oral Tube size: 7.5 mm Number of attempts: 1 Airway Equipment and Method: Stylet Placement Confirmation: ETT inserted through vocal cords under direct vision,  positive ETCO2 and breath sounds checked- equal and bilateral Secured at: 21 cm Tube secured with: Tape Dental Injury: Teeth and Oropharynx as per pre-operative assessment

## 2020-06-20 NOTE — H&P (Signed)
PRE-OP H&P  Office Visit Report     06/10/2020   --------------------------------------------------------------------------------   Bruce Douglas  MRN: [id number]  DOB: 15-Jun-1961, 59 year old Male  PRIMARY CARE:  Serita Grammes, MD  REFERRING:  Serita Grammes, MD  PROVIDER:  Ellison Hughs, M.D.  TREATING:  Jiles Crocker, NP  LOCATION:  [facility] [phone]     --------------------------------------------------------------------------------   CC/HPI: Left renal mass   HPI: Bruce Douglas is a 59 year old male with a 1.8 cm cystic left renal lesion with features concerning for RCC. The lesion was initially identified on CT from 10/26/18 during an evaluation for abdominal pain and was initially classified as a Bosniak 24F cyst. Subsequent surveillance MRI on 10/17/19 showed 2 new mural nodules within the cystic lesion that were not present on MRI from 11/2018.   04/28/20: The patient is here today for a routine follow-up. His surveillance MRI shows slightly interval enlargement of the complex, endophytic cystic lesion involving the left kidney. Benign, Bosniak 1 right renal cysts noted. He denies interval episodes of flank pain or hematuria. He reports that he has a torn right rotator cuff and is planning on having it addressed after his nephrectomy.   06/10/2020: Here for preoperative appointment prior to undergoing left robotic nephrectomy with his urologist on 09/17.   Since last office visit, the patient is taking Celebrex daily as well as Tylenol to help manage joint pain related to his torn right rotator cuff. He has follow-up with orthopedics prior to procedure but any plan intervention will be deferred until after his nephrectomy. Patient also taking albuterol daily as needed for respiratory issues related to underlying asthma disease. He is on a daily steroid inhaler as well. He denies other changes to past medical history, no interval infection treatment, no  interval surgical procedure. Denies interval fevers or chills, nausea/vomiting. Patient at baseline does have some obstructive voiding symptoms with intermittent straining and hesitancy as well as intermittently bothersome daytime frequency/urgency as well as nocturia. He mentions to me wanting to pursue further evaluation/treatment of this once he is recovered from his nephrectomy. Denies any interval burning or painful urination, visible blood in the urine.     ALLERGIES: None   MEDICATIONS: Levothyroxine Sodium 100 mcg tablet  Omeprazole 20 mg tablet, delayed release  Amlodipine Besylate 5 mg tablet  Atenolol 25 mg tablet  Bupropion Xl 300 mg tablet, extended release 24 hr  Celebrex  Cyclobenzaprine Hcl 10 mg tablet  Doxazosin Mesylate 4 mg tablet  Quetiapine Fumarate 50 mg tablet  Tylenol     GU PSH: None   NON-GU PSH: Hemorrhoidectomy     GU PMH: Left renal neoplasm - 10/30/2019    NON-GU PMH: Anxiety Depression Hypertension Hypothyroidism    FAMILY HISTORY: Cancer - Runs in Family   SOCIAL HISTORY: Marital Status: Married Preferred Language: English; Race: White Current Smoking Status: Patient smokes. Smokes 1 pack per day.   Tobacco Use Assessment Completed: Used Tobacco in last 30 days? Has never drank.  Drinks 4+ caffeinated drinks per day.    REVIEW OF SYSTEMS:    GU Review Male:   Patient reports frequent urination, hard to postpone urination, get up at night to urinate, trouble starting your stream, and have to strain to urinate . Patient denies burning/ pain with urination, leakage of urine, stream starts and stops, erection problems, and penile pain.  Gastrointestinal (Upper):   Patient denies nausea, vomiting, and indigestion/ heartburn.  Gastrointestinal (Lower):  Patient denies diarrhea and constipation.  Constitutional:   Patient denies fever, night sweats, weight loss, and fatigue.  Skin:   Patient denies skin rash/ lesion and itching.  Eyes:    Patient denies blurred vision and double vision.  Ears/ Nose/ Throat:   Patient denies sinus problems and sore throat.  Hematologic/Lymphatic:   Patient denies swollen glands and easy bruising.  Cardiovascular:   Patient denies leg swelling and chest pains.  Respiratory:   Patient denies cough and shortness of breath.  Endocrine:   Patient denies excessive thirst.  Musculoskeletal:   Patient reports back pain and joint pain.   Neurological:   Patient reports headaches. Patient denies dizziness.  Psychologic:   Patient denies depression and anxiety.   Notes: Updated from previous visit 04/28/2020 with review from patient as noted above.   VITAL SIGNS:      06/10/2020 10:47 AM  Weight 150 lb / 68.04 kg  Height 70 in / 177.8 cm  BP 113/75 mmHg  Pulse 69 /min  Temperature 97.3 F / 36.2 C  BMI 21.5 kg/m   MULTI-SYSTEM PHYSICAL EXAMINATION:    Constitutional: Well-nourished. No physical deformities. Normally developed. Good grooming.  Neck: Neck symmetrical, not swollen. Normal tracheal position.  Respiratory: No labored breathing, no use of accessory muscles.   Cardiovascular: Normal temperature, normal extremity pulses, no swelling, no varicosities.  Lymphatic: No enlargement of neck, axillae, groin.  Neurologic / Psychiatric: Oriented to time, oriented to place, oriented to person. No depression, no anxiety, no agitation.  Gastrointestinal: No mass, no tenderness, no rigidity, non obese abdomen.  Musculoskeletal: Normal gait and station of head and neck.     Complexity of Data:  Source Of History:  Patient, Medical Record Summary  Records Review:   Previous Doctor Records, Previous Hospital Records, Previous Patient Records  Urine Test Review:   Urinalysis  X-Ray Review: MRI Abdomen: Reviewed Report.     06/10/20  Urinalysis  Urine Appearance Clear   Urine Color Yellow   Urine Glucose Neg mg/dL  Urine Bilirubin Neg mg/dL  Urine Ketones Neg mg/dL  Urine Specific Gravity  1.020   Urine Blood 1+ ery/uL  Urine pH <=5.0   Urine Protein Neg mg/dL  Urine Urobilinogen 0.2 mg/dL  Urine Nitrites Neg   Urine Leukocyte Esterase Neg leu/uL  Urine WBC/hpf NS (Not Seen)   Urine RBC/hpf 0 - 2/hpf   Urine Epithelial Cells NS (Not Seen)   Urine Bacteria Rare (0-9/hpf)   Urine Mucous Present   Urine Yeast NS (Not Seen)   Urine Trichomonas Not Present   Urine Cystals NS (Not Seen)   Urine Casts NS (Not Seen)   Urine Sperm Not Present   Notes:                     CLINICAL DATA: Follow-up complex left renal cyst   EXAM:  MRI ABDOMEN WITHOUT AND WITH CONTRAST   TECHNIQUE:  Multiplanar multisequence MR imaging of the abdomen was performed  both before and after the administration of intravenous contrast.   CONTRAST: 49mL MULTIHANCE GADOBENATE DIMEGLUMINE 529 MG/ML IV SOLN   COMPARISON: MR abdomen, 10/17/2019, 11/07/2018   FINDINGS:  Lower chest: No acute findings.   Hepatobiliary: No mass or other parenchymal abnormality identified.   Pancreas: No mass, inflammatory changes, or other parenchymal  abnormality identified.   Spleen: Within normal limits in size and appearance.   Adrenals/Urinary Tract: There is a redemonstrated complex solid and  cystic lesion of the  inferior pole of the left kidney. Internal  contrast enhancing nodular components have significantly increased  in size compared to prior examination dated 10/17/2019, now  measuring in aggregate approximately 1.7 x 1.1 cm (series 12, image  44). Overall lesion dimensions are approximately 2.1 x 1.8 cm, not  significantly changed compared to prior examination when measured  similarly (series 9, image 19). No evidence of hydronephrosis.   Stomach/Bowel: Visualized portions within the abdomen are  unremarkable.   Vascular/Lymphatic: No pathologically enlarged lymph nodes  identified. No abdominal aortic aneurysm demonstrated.   Other: None.   Musculoskeletal: No suspicious bone lesions  identified.   IMPRESSION:  1. There is a redemonstrated complex solid and cystic lesion of the  inferior pole of the left kidney. Internal contrast enhancing  nodular components have significantly increased in size compared to  prior examination when measured similarly, now measuring in  aggregate approximately 1.7 x 1.1 cm. Findings are consistent with  enlarging renal cell carcinoma.  2. No evidence of metastatic disease in the abdomen. No evidence of  renal vein invasion or lymphadenopathy.    Electronically Signed  By: Eddie Candle M.D.  On: 04/22/2020 11:22     PROCEDURES:          Urinalysis w/Scope Dipstick Dipstick Cont'd Micro  Color: Yellow Bilirubin: Neg mg/dL WBC/hpf: NS (Not Seen)  Appearance: Clear Ketones: Neg mg/dL RBC/hpf: 0 - 2/hpf  Specific Gravity: 1.020 Blood: 1+ ery/uL Bacteria: Rare (0-9/hpf)  pH: <=5.0 Protein: Neg mg/dL Cystals: NS (Not Seen)  Glucose: Neg mg/dL Urobilinogen: 0.2 mg/dL Casts: NS (Not Seen)    Nitrites: Neg Trichomonas: Not Present    Leukocyte Esterase: Neg leu/uL Mucous: Present      Epithelial Cells: NS (Not Seen)      Yeast: NS (Not Seen)      Sperm: Not Present    ASSESSMENT:      ICD-10 Details  1 GU:   Left renal neoplasm - D49.512 Left, Chronic, Worsening  2 NON-GU:   Pyuria/other UA findings - R82.79 Minor   PLAN:           Orders Labs Urine Culture          Schedule Return Visit/Planned Activity: Keep Scheduled Appointment - Schedule Surgery, Follow up MD          Document Letter(s):  Created for Patient: Clinical Summary         Notes:   -I reviewed imaging results and films with the patient personally. We discussed that the mass in question has features concerning for malignancy. I explained the natural history of presumed renal cell carcinoma. I reviewed the AUA guidelines for evaluation and treatment of renal masses. The options of active surveillance, in situ tumor ablation, partial and radical nephrectomy was  discussed. The risks of robot-assisted laparoscopic LEFT radical nephrectomy were discussed in detail including but not limited to: negative pathology, open conversion, infection of the skin/abdominal cavity, VTE, MI/CVA, lymphatic leak, injury to adjacent solid/hollow viscus organs, bleeding requiring a blood transfusion, catastrophic bleeding, hernia formation and other imponderables. The patient voices understanding and wishes to proceed.

## 2020-06-20 NOTE — Anesthesia Preprocedure Evaluation (Addendum)
Anesthesia Evaluation  Patient identified by MRN, date of birth, ID band Patient awake    Reviewed: Allergy & Precautions, NPO status , Patient's Chart, lab work & pertinent test results  History of Anesthesia Complications Negative for: history of anesthetic complications  Airway Mallampati: I  TM Distance: >3 FB Neck ROM: Full    Dental  (+) Edentulous Upper, Edentulous Lower   Pulmonary asthma , COPD (does not use inhalers), Current Smoker and Patient abstained from smoking.,  06/17/2020 SARS coronavirus NEG   breath sounds clear to auscultation       Cardiovascular hypertension, Pt. on medications and Pt. on home beta blockers (-) angina Rhythm:Regular Rate:Normal     Neuro/Psych PSYCHIATRIC DISORDERS Anxiety Depression negative neurological ROS     GI/Hepatic GERD  Medicated and Controlled,H/O alcohol and substance abuse, no use in over 10 years   Endo/Other  Hypothyroidism   Renal/GU Renal Mass     Musculoskeletal  (+) Arthritis ,   Abdominal   Peds  Hematology negative hematology ROS (+)   Anesthesia Other Findings   Reproductive/Obstetrics                           Anesthesia Physical Anesthesia Plan  ASA: III  Anesthesia Plan: General   Post-op Pain Management:    Induction: Intravenous  PONV Risk Score and Plan: 3 and Ondansetron, Dexamethasone and Midazolam  Airway Management Planned: Oral ETT  Additional Equipment: None  Intra-op Plan:   Post-operative Plan: Extubation in OR  Informed Consent: I have reviewed the patients History and Physical, chart, labs and discussed the procedure including the risks, benefits and alternatives for the proposed anesthesia with the patient or authorized representative who has indicated his/her understanding and acceptance.       Plan Discussed with: Anesthesiologist, CRNA and Surgeon  Anesthesia Plan Comments:        Anesthesia Quick Evaluation

## 2020-06-20 NOTE — Transfer of Care (Signed)
Immediate Anesthesia Transfer of Care Note  Patient: Bruce Douglas  Procedure(s) Performed: XI ROBOTIC ASSISTED LAPAROSCOPIC RADICAL NEPHRECTOMY (Left Abdomen)  Patient Location: PACU  Anesthesia Type:General  Level of Consciousness: drowsy  Airway & Oxygen Therapy: Patient Spontanous Breathing and Patient connected to face mask oxygen  Post-op Assessment: Report given to RN and Post -op Vital signs reviewed and stable  Post vital signs: Reviewed and stable  Last Vitals:  Vitals Value Taken Time  BP 134/82 06/20/20 1430  Temp    Pulse 68 06/20/20 1439  Resp 8 06/20/20 1439  SpO2 96 % 06/20/20 1439  Vitals shown include unvalidated device data.  Last Pain:  Vitals:   06/20/20 1108  TempSrc: Oral      Patients Stated Pain Goal: 3 (94/37/00 5259)  Complications: No complications documented.

## 2020-06-21 ENCOUNTER — Encounter (HOSPITAL_COMMUNITY): Payer: Self-pay | Admitting: Urology

## 2020-06-21 LAB — BASIC METABOLIC PANEL
Anion gap: 9 (ref 5–15)
BUN: 17 mg/dL (ref 6–20)
CO2: 22 mmol/L (ref 22–32)
Calcium: 8.5 mg/dL — ABNORMAL LOW (ref 8.9–10.3)
Chloride: 105 mmol/L (ref 98–111)
Creatinine, Ser: 1.52 mg/dL — ABNORMAL HIGH (ref 0.61–1.24)
GFR calc Af Amer: 57 mL/min — ABNORMAL LOW (ref >60)
GFR calc non Af Amer: 49 mL/min — ABNORMAL LOW (ref >60)
Glucose, Bld: 117 mg/dL — ABNORMAL HIGH (ref 70–99)
Potassium: 3.7 mmol/L (ref 3.5–5.1)
Sodium: 136 mmol/L (ref 135–145)

## 2020-06-21 LAB — HEMOGLOBIN AND HEMATOCRIT, BLOOD
HCT: 35.9 % — ABNORMAL LOW (ref 39.0–52.0)
Hemoglobin: 12.3 g/dL — ABNORMAL LOW (ref 13.0–17.0)

## 2020-06-21 MED ORDER — CHLORHEXIDINE GLUCONATE CLOTH 2 % EX PADS
6.0000 | MEDICATED_PAD | Freq: Every day | CUTANEOUS | Status: DC
  Administered 2020-06-23: 6 via TOPICAL

## 2020-06-21 MED ORDER — PROMETHAZINE HCL 25 MG/ML IJ SOLN
25.0000 mg | INTRAMUSCULAR | Status: DC | PRN
  Administered 2020-06-22 – 2020-06-23 (×4): 25 mg via INTRAVENOUS
  Filled 2020-06-21 (×4): qty 1

## 2020-06-21 NOTE — Progress Notes (Signed)
1 Day Post-Op   Subjective/Chief Complaint:  1 - LEFT Renal Mass - s/p LEFT robotic nephrectomy 06/20/20 by Winter. 9/18 Hgb 12.3, Cr 1.52.  Today "Bruce Douglas" is stable. Pain managable. Hasnt' been out of bed much yet. Some nausea last PM that is improving, now feelign hungry. Hgb and Cr acceptable.    Objective: Vital signs in last 24 hours: Temp:  [97.3 F (36.3 C)-98.6 F (37 C)] 98.6 F (37 C) (09/18 0504) Pulse Rate:  [68-83] 83 (09/18 0504) Resp:  [11-16] 12 (09/18 0504) BP: (104-134)/(70-92) 110/74 (09/18 0504) SpO2:  [92 %-100 %] 95 % (09/18 0504) Last BM Date: 06/18/20  Intake/Output from previous day: 09/17 0701 - 09/18 0700 In: 3700.5 [I.V.:3600.5; IV Piggyback:100] Out: 790 [Urine:750; Blood:40] Intake/Output this shift: No intake/output data recorded.  General appearance: alert and cooperative Eyes: negative Nose: Nares normal. Septum midline. Mucosa normal. No drainage or sinus tenderness. Throat: lips, mucosa, and tongue normal; teeth and gums normal Neck: supple, symmetrical, trachea midline Back: symmetric, no curvature. ROM normal. No CVA tenderness. Resp: Non-labored on room air.  Cardio: Nl rate GI: soft, non-tender; bowel sounds normal; no masses,  no organomegaly and recent port and extraction sites c/d/i. NO r/g. NO distension.  Male genitalia: normal, foely with non-foul urine. Removed.  Extremities: extremities normal, atraumatic, no cyanosis or edema Skin: Skin color, texture, turgor normal. No rashes or lesions Lymph nodes: Cervical, supraclavicular, and axillary nodes normal. Neurologic: Grossly normal  Lab Results:  Recent Labs    06/20/20 1446 06/21/20 0603  HGB 13.1 12.3*  HCT 38.0* 35.9*   BMET Recent Labs    06/21/20 0603  NA 136  K 3.7  CL 105  CO2 22  GLUCOSE 117*  BUN 17  CREATININE 1.52*  CALCIUM 8.5*   PT/INR No results for input(s): LABPROT, INR in the last 72 hours. ABG No results for input(s): PHART, HCO3 in the  last 72 hours.  Invalid input(s): PCO2, PO2  Studies/Results: No results found.  Anti-infectives: Anti-infectives (From admission, onward)   Start     Dose/Rate Route Frequency Ordered Stop   06/20/20 2000  ceFAZolin (ANCEF) IVPB 1 g/50 mL premix        1 g 100 mL/hr over 30 Minutes Intravenous Every 8 hours 06/20/20 1621 06/21/20 0429   06/20/20 1115  ceFAZolin (ANCEF) IVPB 2g/100 mL premix        2 g 200 mL/hr over 30 Minutes Intravenous  Once 06/20/20 1111 06/20/20 1231      Assessment/Plan:  Doing well POD 1. Goals for DC discussed. DC foley, reg diet, SLIV, ambulate. Likely DC tomorrow AM based on current progress.   Alexis Frock 06/21/2020

## 2020-06-22 LAB — GLUCOSE, CAPILLARY: Glucose-Capillary: 126 mg/dL — ABNORMAL HIGH (ref 70–99)

## 2020-06-22 MED ORDER — DOCUSATE SODIUM 100 MG PO CAPS: 100.0000 mg | ORAL_CAPSULE | Freq: Two times a day (BID) | ORAL | Status: DC

## 2020-06-22 MED ORDER — HYDROCODONE-ACETAMINOPHEN 5-325 MG PO TABS: 1.0000 | ORAL_TABLET | Freq: Four times a day (QID) | ORAL | 0 refills | Status: DC | PRN

## 2020-06-22 MED ORDER — SODIUM CHLORIDE 0.9 % IV SOLN: INTRAVENOUS | Status: DC

## 2020-06-22 NOTE — Progress Notes (Signed)
After early AM eval, pt with recurrent nausea and some emesis. Now keeping less PO down. Will refrain from DC, restart some IVF, order AM CMP, H/H. Consider axial imaging if new fevers, refracotry emesis, or new abd distention. He has none of that at present.

## 2020-06-22 NOTE — Progress Notes (Signed)
Attempt to give pt scheduled night time medications, after taking first half of pills, pt immediately became nauseated and vomited. All pills noted to be in emesis. Pt at this time stating " I dont want to try at that again." Importance of certain medications explained to pt and he verbalizes understanding of not having certain medications. PRN nausea medication given. Pt wishing to rest at this time.   No further needs or concerns expressed at this time.    Will continue to assess and monitor pt.

## 2020-06-22 NOTE — Discharge Summary (Signed)
Physician Discharge Summary  Patient ID: Bruce Douglas MRN: [id number] DOB/AGE: 12-30-1960 59 y.o.  Admit date: 06/20/2020 Discharge date: 06/22/2020  Admission Diagnoses: LEFT renal mass  Discharge Diagnoses:  Active Problems:   Renal mass   Discharged Condition: good  Hospital Course: Pt underwent LEFT robotic nephrectomy by Dr. Lovena Douglas on 06/20/20 withotu acute complication. By the AM of POD 2, the day of discharge, he is ambulatory, pain controlled on PO meds, maintaining PO nutrition, and felt to be adequtae for discharge. Path pending. Cr 1.52, Hgb 12.3  Consults: None  Significant Diagnostic Studies: labs: as per above  Treatments: surgery: as per above  Discharge Exam: Blood pressure 101/80, pulse 72, temperature 98.6 F (37 C), temperature source Oral, resp. rate 18, height 5\' 10"  (1.778 m), weight 67.6 kg, SpO2 (!) 89 %.  General appearance: alert and cooperative Eyes: negative Nose: Nares normal. Septum midline. Mucosa normal. No drainage or sinus tenderness. Throat: lips, mucosa, and tongue normal; teeth and gums normal Neck: supple, symmetrical, trachea midline Back: symmetric, no curvature. ROM normal. No CVA tenderness. Resp: Non-labored on room air.  Cardio: Nl rate GI: soft, non-tender; bowel sounds normal; no masses,  no organomegaly and recent port and extraction sites c/d/i. NO r/g. NO distension.  Male genitalia: normal, foely with non-foul urine. Removed.  Extremities: extremities normal, atraumatic, no cyanosis or edema Skin: Skin color, texture, turgor normal. No rashes or lesions Lymph nodes: Cervical, supraclavicular, and axillary nodes normal. Neurologic: Grossly normal  Disposition: HOME   Allergies as of 06/22/2020   No Known Allergies     Medication List    STOP taking these medications   celecoxib 200 MG capsule Commonly known as: CELEBREX   multivitamin with minerals Tabs tablet     TAKE these medications   acetaminophen 500 MG  tablet Commonly known as: TYLENOL Take 1,000 mg by mouth in the morning and at bedtime. Take with celecoxib   amLODipine 5 MG tablet Commonly known as: NORVASC Take 5 mg by mouth at bedtime.   atenolol 25 MG tablet Commonly known as: TENORMIN Take 25 mg by mouth at bedtime.   buPROPion 300 MG 24 hr tablet Commonly known as: WELLBUTRIN XL Take 300 mg by mouth at bedtime.   cyclobenzaprine 10 MG tablet Commonly known as: FLEXERIL Take 10 mg by mouth See admin instructions. Take 1 tablet (10 mg) by mouth scheduled at bedtime, may take an additional dose during the day if needed for back exertion.   docusate sodium 100 MG capsule Commonly known as: COLACE Take 1 capsule (100 mg total) by mouth 2 (two) times daily.   doxazosin 4 MG tablet Commonly known as: CARDURA Take 4 mg by mouth at bedtime.   HYDROcodone-acetaminophen 5-325 MG tablet Commonly known as: Norco Take 1-2 tablets by mouth every 6 (six) hours as needed for moderate pain.   levothyroxine 137 MCG tablet Commonly known as: SYNTHROID Take 137 mcg by mouth at bedtime.   omeprazole 20 MG capsule Commonly known as: PRILOSEC Take 40 mg by mouth at bedtime.   promethazine 12.5 MG tablet Commonly known as: PHENERGAN Take 1 tablet (12.5 mg total) by mouth every 4 (four) hours as needed for nausea or vomiting.   QUEtiapine 50 MG tablet Commonly known as: SEROQUEL Take 50 mg by mouth at bedtime.   varenicline 1 MG tablet Commonly known as: CHANTIX Take 1 mg by mouth 2 (two) times daily.       Follow-up Information    Bruce Douglas  Bruce Lies, MD On 07/04/2020.   Specialty: Urology Why: at 8:30 Contact information: [address] [place] [address] [phone]               Signed: Alexis Douglas 06/22/2020, 7:41 AM

## 2020-06-22 NOTE — Progress Notes (Signed)
Patient is alert, oriented, calm and cooperative. During this shift, patient has mostly been in the bed and had PRN Zofran and Phenergan for nausea and vomiting, as well as PRN Dilaudid for surgical pain. Surgical site looks clean, dry, and intact. Patient has not been able to keep anything down other than bites of crackers and some ginger ale after getting the PRN Zofran and Phenergan. Patient has been able to rest some and was able to get up and ambulate in the hall (90 feet). Patient tolerated the ambulation well and ended in the chair after ambulating. Patient's plan of care consists of pain management, control nausea and vomiting, and intake and output.

## 2020-06-22 NOTE — Progress Notes (Signed)
Notified Dr. Tresa Moore that pt has had nausea & vomiting this morning and received zofran and phenergan with little relief.  Pt presently unable to tolerate anything PO. Dr. Tresa Moore will cancel discharge for today. Jaron Czarnecki, Laurel Dimmer, RN

## 2020-06-22 NOTE — Progress Notes (Signed)
Patient is back in the bed starting to feel and look better. Patient is still alert and oriented, has used the bathroom this shift. Patient is on Normal Saline at 50 mL/hr IV continuously. MD discontinued discharge orders due recurrent nausea, some emesis, and keeping less intake down. MD gave the order to restart some IV fluids, which is the Normal Saline at 50 mL/hr IV continuously. Patient also gave some new orders and recommendations. See Urology Physician's note at 06/22/2020 at 1704.

## 2020-06-23 ENCOUNTER — Inpatient Hospital Stay (HOSPITAL_COMMUNITY): Payer: 59

## 2020-06-23 LAB — HEMOGLOBIN AND HEMATOCRIT, BLOOD
HCT: 40.2 % (ref 39.0–52.0)
Hemoglobin: 13.5 g/dL (ref 13.0–17.0)

## 2020-06-23 LAB — COMPREHENSIVE METABOLIC PANEL
ALT: 9 U/L (ref 0–44)
AST: 13 U/L — ABNORMAL LOW (ref 15–41)
Albumin: 3.6 g/dL (ref 3.5–5.0)
Alkaline Phosphatase: 49 U/L (ref 38–126)
Anion gap: 9 (ref 5–15)
BUN: 27 mg/dL — ABNORMAL HIGH (ref 6–20)
CO2: 25 mmol/L (ref 22–32)
Calcium: 8.9 mg/dL (ref 8.9–10.3)
Chloride: 104 mmol/L (ref 98–111)
Creatinine, Ser: 1.59 mg/dL — ABNORMAL HIGH (ref 0.61–1.24)
GFR calc Af Amer: 54 mL/min — ABNORMAL LOW (ref >60)
GFR calc non Af Amer: 47 mL/min — ABNORMAL LOW (ref >60)
Glucose, Bld: 121 mg/dL — ABNORMAL HIGH (ref 70–99)
Potassium: 3.9 mmol/L (ref 3.5–5.1)
Sodium: 138 mmol/L (ref 135–145)
Total Bilirubin: 0.7 mg/dL (ref 0.3–1.2)
Total Protein: 6.9 g/dL (ref 6.5–8.1)

## 2020-06-23 LAB — SURGICAL PATHOLOGY

## 2020-06-23 MED ORDER — CHLORPROMAZINE HCL 25 MG/ML IJ SOLN
25.0000 mg | Freq: Three times a day (TID) | INTRAMUSCULAR | Status: DC | PRN
  Administered 2020-06-23: 25 mg via INTRAMUSCULAR
  Filled 2020-06-23 (×3): qty 1

## 2020-06-23 MED ORDER — HYDRALAZINE HCL 20 MG/ML IJ SOLN: 10.0000 mg | Freq: Three times a day (TID) | INTRAMUSCULAR | Status: DC | PRN

## 2020-06-23 MED ORDER — BISACODYL 10 MG RE SUPP
10.0000 mg | Freq: Once | RECTAL | Status: AC
  Administered 2020-06-23: 10 mg via RECTAL
  Filled 2020-06-23: qty 1

## 2020-06-23 MED ORDER — METOPROLOL TARTRATE 5 MG/5ML IV SOLN
2.5000 mg | Freq: Four times a day (QID) | INTRAVENOUS | Status: DC
  Administered 2020-06-23 – 2020-06-24 (×5): 2.5 mg via INTRAVENOUS
  Filled 2020-06-23 (×6): qty 5

## 2020-06-23 MED ORDER — PANTOPRAZOLE SODIUM 40 MG IV SOLR
40.0000 mg | Freq: Every day | INTRAVENOUS | Status: DC
  Administered 2020-06-23: 40 mg via INTRAVENOUS
  Filled 2020-06-23: qty 40

## 2020-06-23 MED ORDER — LEVOTHYROXINE SODIUM 100 MCG/5ML IV SOLN
68.0000 ug | Freq: Every day | INTRAVENOUS | Status: DC
  Administered 2020-06-23 – 2020-06-24 (×2): 68 ug via INTRAVENOUS
  Filled 2020-06-23 (×2): qty 5

## 2020-06-23 NOTE — Progress Notes (Signed)
Results of abdominal xray called to Dr. Lovena Neighbours.  Orders received to insert NG tube if pt has more episodes of vomiting. Will continue to monitor patient.  Koal Eslinger, Laurel Dimmer, RN

## 2020-06-23 NOTE — Plan of Care (Signed)
  Problem: Bowel/Gastric: Goal: Gastrointestinal status for postoperative course will improve Outcome: Progressing   Problem: Respiratory: Goal: Ability to achieve and maintain a regular respiratory rate will improve Outcome: Progressing   Problem: Education: Goal: Knowledge of the prescribed therapeutic regimen will improve Outcome: Completed/Met   Problem: Skin Integrity: Goal: Demonstration of wound healing without infection will improve Outcome: Completed/Met   Problem: Urinary Elimination: Goal: Ability to avoid or minimize complications of infection will improve Outcome: Completed/Met

## 2020-06-23 NOTE — Progress Notes (Signed)
Pt only had one episode of vomiting early this morning.  Minimal complaints of nausea throughout the shift. Reported to on-coming nurse in regards to order to place NG tube should patient have vomiting during the night.  Pt ambulated in hallway this evening and tolerated well. Nerea Bordenave, Laurel Dimmer, RN

## 2020-06-23 NOTE — Progress Notes (Signed)
Dr. Lovena Neighbours notified that pt has PO blood pressure/cardiac meds ordered and cannot tolerate POs at this time.  Orders given for IV meds for BP in place of giving POs at this time.  Consulted with pharmacist Arlyn Dunning regarding the new med orders. Bruce Douglas, Laurel Dimmer, RN

## 2020-06-23 NOTE — Progress Notes (Signed)
3 Days Post-Op Subjective: Two episodes of vomiting last night.  Still nauseous this AM.  No flatus or BM.  Voiding w/o difficulty. Pain controlled.  Labs unremarkable.   Objective: Vital signs in last 24 hours: Temp:  [98.3 F (36.8 C)-98.7 F (37.1 C)] 98.7 F (37.1 C) (09/20 0424) Pulse Rate:  [75-84] 79 (09/20 0424) Resp:  [18-20] 18 (09/20 0424) BP: (113-123)/(79-91) 114/82 (09/20 0424) SpO2:  [92 %-95 %] 93 % (09/20 0424)  Intake/Output from previous day: 09/19 0701 - 09/20 0700 In: 536.3 [P.O.:120; I.V.:416.3] Out: 595 [Urine:400; Emesis/NG output:195]  Intake/Output this shift: No intake/output data recorded.  Physical Exam:  General: Alert and oriented CV: RRR, palpable distal pulses Lungs: CTAB, equal chest rise Abdomen: Soft, NT,  Mildly distended, no rebound or guarding Incisions: c/d/i Ext: NT, No erythema  Lab Results: Recent Labs    06/20/20 1446 06/21/20 0603 06/23/20 0445  HGB 13.1 12.3* 13.5  HCT 38.0* 35.9* 40.2   BMET Recent Labs    06/21/20 0603 06/23/20 0445  NA 136 138  K 3.7 3.9  CL 105 104  CO2 22 25  GLUCOSE 117* 121*  BUN 17 27*  CREATININE 1.52* 1.59*  CALCIUM 8.5* 8.9     Studies/Results: No results found.  Assessment/Plan: POD 3 s/p left robotic radical nephrectomy   -KUB pending to assess for suspected post-op ileus.  Continue PRN anti-emetics -Clears liquids only.  Continue IVF -OOBTC and ambulate -Will continue to monitor   LOS: 3 days   Ellison Hughs, MD [facility] Pager: [phone]  06/23/2020, 7:56 AM

## 2020-06-24 NOTE — Discharge Summary (Signed)
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 Briefly, Bruce Douglas is a 59 y.o. year old patient with a solid and enhancing left renal mass with features concerning for renal cell carcinoma.  He underwent robot-assisted laparoscopic left radical nephrectomy on 06/20/2020.  Hospital Course: Postoperatively, the patient was monitored on the floor.  He had issues with nausea and vomiting until postop day 4.  On postop day 4, the patient had several bowel movements and was passing flatus.  He was tolerating a regular diet and ambulating without significant pain.  He was also urinating without difficulty.  He was then discharged home.  Physical Exam:  General: Alert and oriented CV: RRR, palpable distal pulses Lungs: CTAB, equal chest rise Abdomen: Soft, NTND, no rebound or guarding Incisions: Clean, dry and intact Ext: NT, No erythema  Laboratory values: Recent Labs    06/23/20 0445  HGB 13.5  HCT 40.2   Recent Labs    06/23/20 0445  CREATININE 1.59*    Disposition: Home  Discharge instruction: The patient was instructed to be ambulatory but told to refrain from heavy lifting, strenuous activity, or driving.  Discharge medications:  Allergies as of 06/24/2020   No Known Allergies     Medication List    STOP taking these medications   celecoxib 200 MG capsule Commonly known as: CELEBREX   multivitamin with minerals Tabs tablet     TAKE these medications   acetaminophen 500 MG tablet Commonly known as: TYLENOL Take 1,000 mg by mouth in the morning and at bedtime. Take with celecoxib   amLODipine 5 MG tablet Commonly known as: NORVASC Take 5 mg by mouth at bedtime.   atenolol 25 MG tablet Commonly known as: TENORMIN Take 25  mg by mouth at bedtime.   buPROPion 300 MG 24 hr tablet Commonly known as: WELLBUTRIN XL Take 300 mg by mouth at bedtime.   cyclobenzaprine 10 MG tablet Commonly known as: FLEXERIL Take 10 mg by mouth See admin instructions. Take 1 tablet (10 mg) by mouth scheduled at bedtime, may take an additional dose during the day if needed for back exertion.   docusate sodium 100 MG capsule Commonly known as: COLACE Take 1 capsule (100 mg total) by mouth 2 (two) times daily.   doxazosin 4 MG tablet Commonly known as: CARDURA Take 4 mg by mouth at bedtime.   HYDROcodone-acetaminophen 5-325 MG tablet Commonly known as: Norco Take 1-2 tablets by mouth every 6 (six) hours as needed for moderate pain.   levothyroxine 137 MCG tablet Commonly known as: SYNTHROID Take 137 mcg by mouth at bedtime.   omeprazole 20 MG capsule Commonly known as: PRILOSEC Take 40 mg by mouth at bedtime.   promethazine 12.5 MG tablet Commonly known as: PHENERGAN Take 1 tablet (12.5 mg total) by mouth every 4 (four) hours as needed for nausea or vomiting.   QUEtiapine 50 MG tablet Commonly known as: SEROQUEL Take 50 mg by mouth at bedtime.   varenicline 1 MG tablet Commonly known as: CHANTIX Take 1 mg by mouth 2 (two) times daily.            Discharge Care Instructions  (From admission, onward)         Start     Ordered   06/24/20 0000  Discharge wound care:       Comments: Okay to shower.  Let surgical glue fall off on its own.  Keep incisions clean and dry   06/24/20 1704          Followup:   Follow-up Information    Ceasar Mons, MD On 07/04/2020.   Specialty: Urology Why: at 8:30 Contact information: [place] [address] [place] [address] [phone]

## 2020-06-24 NOTE — Progress Notes (Signed)
Patient ambulated small distance twice this shift via walker, tolerated well. No episode of vomiting this shift. Tolerated po med. Stated he has small BM last night.  Prefer to void directing to  commode and not with urinals as requested by writer for accurate measurement of output.

## 2020-06-24 NOTE — Progress Notes (Signed)
4 Days Post-Op Subjective: NAEO.  Nausea improving.  No vomiting over the past shift.  Had a sizable BM this AM.  Pain controlled.  Appetite improving and is asking for Cheerios.    Objective: Vital signs in last 24 hours: Temp:  [98.7 F (37.1 C)-99 F (37.2 C)] 98.9 F (37.2 C) (09/21 0437) Pulse Rate:  [77-84] 81 (09/21 0437) Resp:  [18-20] 18 (09/21 0437) BP: (110-125)/(78-88) 113/82 (09/21 0437) SpO2:  [92 %-93 %] 92 % (09/21 0437)  Intake/Output from previous day: 09/20 0701 - 09/21 0700 In: 1242.9 [P.O.:120; I.V.:1122.9] Out: -   Intake/Output this shift: No intake/output data recorded.  Physical Exam:  General: Alert and oriented CV: RRR, palpable distal pulses Lungs: CTAB, equal chest rise Abdomen: Soft, NTND, no rebound or guarding Incisions: c/d/i Ext: NT, No erythema  Lab Results: Recent Labs    06/23/20 0445  HGB 13.5  HCT 40.2   BMET Recent Labs    06/23/20 0445  NA 138  K 3.9  CL 104  CO2 25  GLUCOSE 121*  BUN 27*  CREATININE 1.59*  CALCIUM 8.9     Studies/Results: DG Abd 1 View  Result Date: 06/23/2020 CLINICAL DATA:  Status post nephrectomy, nausea, vomiting, abdominal pain EXAM: ABDOMEN - 1 VIEW COMPARISON:  None FINDINGS: The superior abdomen is incompletely imaged, the bilateral hemidiaphragms excluded. Within this limitation, there are loops of gas distended small bowel in the upper abdomen, maximum caliber approximately 4.5 cm and scattered gas elsewhere, including to the distal colon. There is small volume pneumoperitoneum. Osseous structures are unremarkable. IMPRESSION: 1. The superior abdomen is incompletely imaged, the bilateral hemidiaphragms excluded. Within this limitation, there are loops of gas distended small bowel in the upper abdomen, maximum caliber approximately 4.5 cm and scattered gas elsewhere, including to the distal colon. Findings most likely reflect postoperative ileus. 2.  Small volume postoperative pneumoperitoneum.  Electronically Signed   By: Eddie Candle M.D.   On: 06/23/2020 09:13    Assessment/Plan: POD 4 s/p robotic left nephrectomy with improving post-op ileus  -ADAT -OOBTC and ambulate -d/c IVF -Likely home this afternoon if nausea/vomiting does not return.    LOS: 4 days   Ellison Hughs, MD [facility] Pager: [phone]  06/24/2020, 9:30 AM

## 2020-06-24 NOTE — TOC Initial Note (Signed)
Transition of Care [facility]) - Initial/Assessment Note    Patient Details  Name: Bruce Douglas MRN: [id number] Date of Birth: [birth year]  Transition of Care [facility]) CM/SW Contact:    Dessa Phi, RN Phone Number: 06/24/2020, 11:19 AM  Clinical Narrative:From ome w/spouse. POD $ robotic L nephrectomy. Advancing diet. Currently no d/c needs.Continue tocmonitor.                   Expected Discharge Plan: Home/Self Care Barriers to Discharge: Continued Medical Work up   Patient Goals and CMS Choice Patient states their goals for this hospitalization and ongoing recovery are:: go home CMS Medicare.gov Compare Post Acute Care list provided to:: Patient Choice offered to / list presented to : Patient  Expected Discharge Plan and Services Expected Discharge Plan: Home/Self Care   Discharge Planning Services: CM Consult   Living arrangements for the past 2 months: Single Family Home Expected Discharge Date: 06/22/20                                    Prior Living Arrangements/Services Living arrangements for the past 2 months: Single Family Home Lives with:: Spouse Patient language and need for interpreter reviewed:: Yes Do you feel safe going back to the place where you live?: Yes      Need for Family Participation in Patient Care: No (Comment) Care giver support system in place?: Yes (comment)   Criminal Activity/Legal Involvement Pertinent to Current Situation/Hospitalization: No - Comment as needed  Activities of Daily Living Home Assistive Devices/Equipment: Dentures (specify type) (upper) ADL Screening (condition at time of admission) Patient's cognitive ability adequate to safely complete daily activities?: Yes Is the patient deaf or have difficulty hearing?: No Does the patient have difficulty seeing, even when wearing glasses/contacts?: No Does the patient have difficulty concentrating, remembering, or making decisions?: No Patient able to express need for  assistance with ADLs?: Yes Does the patient have difficulty dressing or bathing?: No Independently performs ADLs?: Yes (appropriate for developmental age) Does the patient have difficulty walking or climbing stairs?: No Weakness of Legs: None Weakness of Arms/Hands: None  Permission Sought/Granted Permission sought to share information with : Case Manager Permission granted to share information with : Yes, Verbal Permission Granted  Share Information with NAME: Case Manager           Emotional Assessment Appearance:: Appears stated age Attitude/Demeanor/Rapport: Gracious Affect (typically observed): Accepting Orientation: : Oriented to Self, Oriented to Place, Oriented to  Time, Oriented to Situation Alcohol / Substance Use: Not Applicable Psych Involvement: No (comment)  Admission diagnosis:  Renal mass [N28.89] Patient Active Problem List   Diagnosis Date Noted  . Renal mass 06/20/2020   PCP:  Serita Grammes, MD Pharmacy:   [id number] - [place], [place] [address] Phone: [phone] Fax: [phone]     Social Determinants of Health (SDOH) Interventions    Readmission Risk Interventions No flowsheet data found.

## 2020-06-25 ENCOUNTER — Inpatient Hospital Stay (HOSPITAL_COMMUNITY)
Admission: EM | Admit: 2020-06-25 | Discharge: 2020-07-03 | DRG: 389 | Disposition: A | Discharge status: Home / Self Care | Payer: 59 | Attending: Urology | Admitting: Urology

## 2020-06-25 ENCOUNTER — Inpatient Hospital Stay (HOSPITAL_COMMUNITY): Payer: 59

## 2020-06-25 ENCOUNTER — Emergency Department (HOSPITAL_COMMUNITY): Payer: 59

## 2020-06-25 ENCOUNTER — Encounter (HOSPITAL_COMMUNITY): Payer: Self-pay

## 2020-06-25 ENCOUNTER — Other Ambulatory Visit: Payer: Self-pay

## 2020-06-25 DIAGNOSIS — K219 Gastro-esophageal reflux disease without esophagitis: Secondary | ICD-10-CM | POA: Diagnosis present

## 2020-06-25 DIAGNOSIS — E039 Hypothyroidism, unspecified: Secondary | ICD-10-CM | POA: Diagnosis present

## 2020-06-25 DIAGNOSIS — C642 Malignant neoplasm of left kidney, except renal pelvis: Secondary | ICD-10-CM | POA: Diagnosis present

## 2020-06-25 DIAGNOSIS — F329 Major depressive disorder, single episode, unspecified: Secondary | ICD-10-CM | POA: Diagnosis present

## 2020-06-25 DIAGNOSIS — J45909 Unspecified asthma, uncomplicated: Secondary | ICD-10-CM | POA: Diagnosis present

## 2020-06-25 DIAGNOSIS — Z6821 Body mass index (BMI) 21.0-21.9, adult: Secondary | ICD-10-CM | POA: Diagnosis not present

## 2020-06-25 DIAGNOSIS — N1831 Chronic kidney disease, stage 3a: Secondary | ICD-10-CM | POA: Diagnosis present

## 2020-06-25 DIAGNOSIS — Z905 Acquired absence of kidney: Secondary | ICD-10-CM | POA: Diagnosis not present

## 2020-06-25 DIAGNOSIS — F17210 Nicotine dependence, cigarettes, uncomplicated: Secondary | ICD-10-CM | POA: Diagnosis present

## 2020-06-25 DIAGNOSIS — Z0189 Encounter for other specified special examinations: Secondary | ICD-10-CM

## 2020-06-25 DIAGNOSIS — R112 Nausea with vomiting, unspecified: Secondary | ICD-10-CM | POA: Diagnosis present

## 2020-06-25 DIAGNOSIS — Y836 Removal of other organ (partial) (total) as the cause of abnormal reaction of the patient, or of later complication, without mention of misadventure at the time of the procedure: Secondary | ICD-10-CM | POA: Diagnosis present

## 2020-06-25 DIAGNOSIS — M19042 Primary osteoarthritis, left hand: Secondary | ICD-10-CM | POA: Diagnosis present

## 2020-06-25 DIAGNOSIS — Z20822 Contact with and (suspected) exposure to covid-19: Secondary | ICD-10-CM | POA: Diagnosis present

## 2020-06-25 DIAGNOSIS — B370 Candidal stomatitis: Secondary | ICD-10-CM | POA: Diagnosis present

## 2020-06-25 DIAGNOSIS — F419 Anxiety disorder, unspecified: Secondary | ICD-10-CM | POA: Diagnosis present

## 2020-06-25 DIAGNOSIS — Z87442 Personal history of urinary calculi: Secondary | ICD-10-CM | POA: Diagnosis not present

## 2020-06-25 DIAGNOSIS — E441 Mild protein-calorie malnutrition: Secondary | ICD-10-CM | POA: Diagnosis present

## 2020-06-25 DIAGNOSIS — M19041 Primary osteoarthritis, right hand: Secondary | ICD-10-CM | POA: Diagnosis present

## 2020-06-25 DIAGNOSIS — I129 Hypertensive chronic kidney disease with stage 1 through stage 4 chronic kidney disease, or unspecified chronic kidney disease: Secondary | ICD-10-CM | POA: Diagnosis present

## 2020-06-25 DIAGNOSIS — Z79890 Hormone replacement therapy (postmenopausal): Secondary | ICD-10-CM | POA: Diagnosis not present

## 2020-06-25 DIAGNOSIS — K567 Ileus, unspecified: Secondary | ICD-10-CM

## 2020-06-25 DIAGNOSIS — K56609 Unspecified intestinal obstruction, unspecified as to partial versus complete obstruction: Secondary | ICD-10-CM

## 2020-06-25 DIAGNOSIS — Z79899 Other long term (current) drug therapy: Secondary | ICD-10-CM

## 2020-06-25 DIAGNOSIS — K9132 Postprocedural complete intestinal obstruction: Secondary | ICD-10-CM | POA: Diagnosis not present

## 2020-06-25 DIAGNOSIS — E440 Moderate protein-calorie malnutrition: Secondary | ICD-10-CM | POA: Insufficient documentation

## 2020-06-25 LAB — CBC WITH DIFFERENTIAL/PLATELET
Abs Immature Granulocytes: 0.03 10*3/uL (ref 0.00–0.07)
Basophils Absolute: 0 10*3/uL (ref 0.0–0.1)
Basophils Relative: 0 %
Eosinophils Absolute: 0.1 10*3/uL (ref 0.0–0.5)
Eosinophils Relative: 2 %
HCT: 37.2 % — ABNORMAL LOW (ref 39.0–52.0)
Hemoglobin: 13.4 g/dL (ref 13.0–17.0)
Immature Granulocytes: 1 %
Lymphocytes Relative: 24 %
Lymphs Abs: 1.1 10*3/uL (ref 0.7–4.0)
MCH: 33.5 pg (ref 26.0–34.0)
MCHC: 36 g/dL (ref 30.0–36.0)
MCV: 93 fL (ref 80.0–100.0)
Monocytes Absolute: 1.3 10*3/uL — ABNORMAL HIGH (ref 0.1–1.0)
Monocytes Relative: 28 %
Neutro Abs: 2 10*3/uL (ref 1.7–7.7)
Neutrophils Relative %: 45 %
Platelets: 271 10*3/uL (ref 150–400)
RBC: 4 MIL/uL — ABNORMAL LOW (ref 4.22–5.81)
RDW: 12.4 % (ref 11.5–15.5)
WBC: 4.5 10*3/uL (ref 4.0–10.5)
nRBC: 0 % (ref 0.0–0.2)

## 2020-06-25 LAB — COMPREHENSIVE METABOLIC PANEL
ALT: 21 U/L (ref 0–44)
AST: 22 U/L (ref 15–41)
Albumin: 3.6 g/dL (ref 3.5–5.0)
Alkaline Phosphatase: 65 U/L (ref 38–126)
Anion gap: 11 (ref 5–15)
BUN: 34 mg/dL — ABNORMAL HIGH (ref 6–20)
CO2: 21 mmol/L — ABNORMAL LOW (ref 22–32)
Calcium: 8.7 mg/dL — ABNORMAL LOW (ref 8.9–10.3)
Chloride: 105 mmol/L (ref 98–111)
Creatinine, Ser: 1.51 mg/dL — ABNORMAL HIGH (ref 0.61–1.24)
GFR calc Af Amer: 58 mL/min — ABNORMAL LOW (ref >60)
GFR calc non Af Amer: 50 mL/min — ABNORMAL LOW (ref >60)
Glucose, Bld: 116 mg/dL — ABNORMAL HIGH (ref 70–99)
Potassium: 3.6 mmol/L (ref 3.5–5.1)
Sodium: 137 mmol/L (ref 135–145)
Total Bilirubin: 1.2 mg/dL (ref 0.3–1.2)
Total Protein: 7.2 g/dL (ref 6.5–8.1)

## 2020-06-25 LAB — LIPASE, BLOOD: Lipase: 67 U/L — ABNORMAL HIGH (ref 11–51)

## 2020-06-25 MED ORDER — POTASSIUM CHLORIDE IN NACL 20-0.45 MEQ/L-% IV SOLN
INTRAVENOUS | Status: DC
  Filled 2020-06-25 (×16): qty 1000

## 2020-06-25 MED ORDER — PROMETHAZINE HCL 25 MG/ML IJ SOLN
25.0000 mg | Freq: Once | INTRAMUSCULAR | Status: AC
  Administered 2020-06-25: 25 mg via INTRAVENOUS
  Filled 2020-06-25: qty 1

## 2020-06-25 MED ORDER — ENOXAPARIN SODIUM 40 MG/0.4ML ~~LOC~~ SOLN
40.0000 mg | SUBCUTANEOUS | Status: DC
  Administered 2020-06-26 – 2020-07-02 (×7): 40 mg via SUBCUTANEOUS
  Filled 2020-06-25 (×8): qty 0.4

## 2020-06-25 MED ORDER — ACETAMINOPHEN 10 MG/ML IV SOLN
1000.0000 mg | Freq: Four times a day (QID) | INTRAVENOUS | Status: AC
  Administered 2020-06-26 – 2020-06-27 (×4): 1000 mg via INTRAVENOUS
  Filled 2020-06-25 (×4): qty 100

## 2020-06-25 MED ORDER — PROMETHAZINE HCL 25 MG/ML IJ SOLN: 12.5000 mg | Freq: Four times a day (QID) | INTRAMUSCULAR | Status: DC | PRN

## 2020-06-25 MED ORDER — MORPHINE SULFATE (PF) 2 MG/ML IV SOLN
1.0000 mg | INTRAVENOUS | Status: DC | PRN
  Administered 2020-06-26 – 2020-06-27 (×6): 2 mg via INTRAVENOUS
  Administered 2020-06-27: 1 mg via INTRAVENOUS
  Administered 2020-06-27 – 2020-06-28 (×4): 2 mg via INTRAVENOUS
  Administered 2020-06-28: 1 mg via INTRAVENOUS
  Administered 2020-06-29: 2 mg via INTRAVENOUS
  Administered 2020-06-29 – 2020-07-01 (×3): 1 mg via INTRAVENOUS
  Filled 2020-06-25 (×16): qty 1

## 2020-06-25 MED ORDER — ONDANSETRON HCL 4 MG/2ML IJ SOLN
4.0000 mg | Freq: Four times a day (QID) | INTRAMUSCULAR | Status: DC | PRN
  Administered 2020-06-27 – 2020-06-28 (×3): 4 mg via INTRAVENOUS
  Filled 2020-06-25 (×3): qty 2

## 2020-06-25 MED ORDER — SODIUM CHLORIDE 0.9 % IV BOLUS
1000.0000 mL | Freq: Once | INTRAVENOUS | Status: AC
  Administered 2020-06-25: 1000 mL via INTRAVENOUS

## 2020-06-25 MED ORDER — PROMETHAZINE HCL 25 MG/ML IJ SOLN
6.2500 mg | INTRAMUSCULAR | Status: DC | PRN
  Administered 2020-06-28: 6.25 mg via INTRAVENOUS
  Filled 2020-06-25 (×2): qty 1

## 2020-06-25 MED ORDER — ONDANSETRON HCL 4 MG/2ML IJ SOLN
4.0000 mg | Freq: Once | INTRAMUSCULAR | Status: DC
  Filled 2020-06-25: qty 2

## 2020-06-25 MED ORDER — IOHEXOL 300 MG/ML  SOLN
100.0000 mL | Freq: Once | INTRAMUSCULAR | Status: AC | PRN
  Administered 2020-06-25: 75 mL via INTRAVENOUS

## 2020-06-25 MED ORDER — MORPHINE SULFATE (PF) 4 MG/ML IV SOLN
4.0000 mg | Freq: Once | INTRAVENOUS | Status: AC
  Administered 2020-06-25: 4 mg via INTRAVENOUS
  Filled 2020-06-25: qty 1

## 2020-06-25 NOTE — ED Provider Notes (Signed)
[facility] DEPT Provider Note   CSN: [id number] Arrival date & time: 06/25/20  1938     History Chief Complaint  Patient presents with  . Post-op Problem    Bruce Douglas is a 59 y.o. male.  Pt presents to the ED today with n/v and diarrhea.  Pt had his left kidney removed on 9/17 by Dr. Lovena Neighbours due to [facility].  The pt had problems with post op n/v and was d/c home on 9/21.  Pt said he's had several episodes of vomiting and some diarrhea since d/c.  He has tried taking the phenergan, but throws it up.  He is unable to tolerate his pain meds.             Past Medical History:  Diagnosis Date  . Anxiety   . Arthritis    hands  . Asthma 06/04/2020  . Cancer ([place])   . Depression   . GERD (gastroesophageal reflux disease)   . History of kidney stones   . Hypertension   . Hypothyroidism     Patient Active Problem List   Diagnosis Date Noted  . Renal mass 06/20/2020    Past Surgical History:  Procedure Laterality Date  . [place]  . ROBOT ASSISTED LAPAROSCOPIC NEPHRECTOMY Left 06/20/2020   Procedure: XI ROBOTIC ASSISTED LAPAROSCOPIC RADICAL NEPHRECTOMY;  Surgeon: Ceasar Mons, MD;  Location: [facility] ORS;  Service: Urology;  Laterality: Left;       No family history on file.  Social History   Tobacco Use  . Smoking status: Current Some Day Smoker    Packs/day: 0.25    Years: 15.00    Pack years: 3.75    Types: Cigarettes  . Smokeless tobacco: Never Used  Vaping Use  . Vaping Use: Never used  Substance Use Topics  . Alcohol use: Not Currently  . Drug use: Not Currently    Comment: recovered drug and ETOH    Home Medications Prior to Admission medications   Medication Sig Start Date End Date Taking? Authorizing Provider  acetaminophen (TYLENOL) 500 MG tablet Take 1,000 mg by mouth in the morning and at bedtime. Take with celecoxib    [provider]  amLODipine (NORVASC) 5 MG tablet Take 5 mg  by mouth at bedtime.  05/20/20   [provider]  atenolol (TENORMIN) 25 MG tablet Take 25 mg by mouth at bedtime.  04/21/20   [provider]  buPROPion (WELLBUTRIN XL) 300 MG 24 hr tablet Take 300 mg by mouth at bedtime.  05/25/20   [provider]  cyclobenzaprine (FLEXERIL) 10 MG tablet Take 10 mg by mouth See admin instructions. Take 1 tablet (10 mg) by mouth scheduled at bedtime, may take an additional dose during the day if needed for back exertion. 04/21/20   [provider]  docusate sodium (COLACE) 100 MG capsule Take 1 capsule (100 mg total) by mouth 2 (two) times daily. 06/22/20   Alexis Frock, MD  doxazosin (CARDURA) 4 MG tablet Take 4 mg by mouth at bedtime. 04/21/20   [provider]  HYDROcodone-acetaminophen (NORCO) 5-325 MG tablet Take 1-2 tablets by mouth every 6 (six) hours as needed for moderate pain. 06/22/20   Alexis Frock, MD  levothyroxine (SYNTHROID) 137 MCG tablet Take 137 mcg by mouth at bedtime.  05/06/20   [provider]  omeprazole (PRILOSEC) 20 MG capsule Take 40 mg by mouth at bedtime.  04/21/20   [provider]  promethazine (PHENERGAN)  12.5 MG tablet Take 1 tablet (12.5 mg total) by mouth every 4 (four) hours as needed for nausea or vomiting. 06/20/20   Debbrah Alar, PA-C  QUEtiapine (SEROQUEL) 50 MG tablet Take 50 mg by mouth at bedtime.  05/03/20   [provider]  varenicline (CHANTIX) 1 MG tablet Take 1 mg by mouth 2 (two) times daily.    [provider]    Allergies    Patient has no known allergies.  Review of Systems   Review of Systems  Gastrointestinal: Positive for abdominal pain, diarrhea, nausea and vomiting.  All other systems reviewed and are negative.   Physical Exam Updated Vital Signs BP 124/83   Pulse 94   Temp 99 F (37.2 C) (Oral)   Resp (!) 21   Ht 5\' 10"  (1.778 m)   Wt 67 kg   SpO2 98%   BMI 21.19 kg/m   Physical Exam Vitals and nursing note  reviewed.  Constitutional:      Appearance: Normal appearance.  HENT:     Head: Normocephalic and atraumatic.     Right Ear: External ear normal.     Left Ear: External ear normal.     Nose: Nose normal.     Mouth/Throat:     Mouth: Mucous membranes are dry.     Comments: thrush Eyes:     Extraocular Movements: Extraocular movements intact.     Conjunctiva/sclera: Conjunctivae normal.     Pupils: Pupils are equal, round, and reactive to light.  Cardiovascular:     Rate and Rhythm: Normal rate and regular rhythm.     Pulses: Normal pulses.     Heart sounds: Normal heart sounds.  Pulmonary:     Effort: Pulmonary effort is normal.     Breath sounds: Normal breath sounds.  Abdominal:     General: Abdomen is flat.     Tenderness: There is generalized abdominal tenderness.     Comments: Surgical sites look good  Musculoskeletal:        General: Normal range of motion.     Cervical back: Normal range of motion and neck supple.  Skin:    General: Skin is warm.     Capillary Refill: Capillary refill takes less than 2 seconds.  Neurological:     General: No focal deficit present.     Mental Status: He is alert and oriented to person, place, and time.  Psychiatric:        Mood and Affect: Mood normal.        Behavior: Behavior normal.     ED Results / Procedures / Treatments   Labs (all labs ordered are listed, but only abnormal results are displayed) Labs Reviewed  CBC WITH DIFFERENTIAL/PLATELET - Abnormal; Notable for the following components:      Result Value   RBC 4.00 (*)    HCT 37.2 (*)    Monocytes Absolute 1.3 (*)    All other components within normal limits  COMPREHENSIVE METABOLIC PANEL - Abnormal; Notable for the following components:   CO2 21 (*)    Glucose, Bld 116 (*)    BUN 34 (*)    Creatinine, Ser 1.51 (*)    Calcium 8.7 (*)    GFR calc non Af Amer 50 (*)    GFR calc Af Amer 58 (*)    All other components within normal limits  LIPASE, BLOOD -  Abnormal; Notable for the following components:   Lipase 67 (*)    All other  components within normal limits  SARS CORONAVIRUS 2 BY RT PCR (HOSPITAL ORDER, [facility])  URINALYSIS, ROUTINE W REFLEX MICROSCOPIC    EKG None  Radiology CT ABDOMEN PELVIS W CONTRAST  Result Date: 06/25/2020 CLINICAL DATA:  Unspecified abdominal pain, vomiting, diarrhea. Status post left nephrectomy. EXAM: CT ABDOMEN AND PELVIS WITH CONTRAST TECHNIQUE: Multidetector CT imaging of the abdomen and pelvis was performed using the standard protocol following bolus administration of intravenous contrast. CONTRAST:  78mL OMNIPAQUE IOHEXOL 300 MG/ML  SOLN COMPARISON:  10/26/2018 FINDINGS: Lower chest: There is discoid atelectasis involving the posterior basal lower lobes bilaterally. Trace bilateral pleural effusions, left greater than right. The visualized heart and pericardium are unremarkable. Hepatobiliary: 9 mm hypodensity is seen within the right hepatic lobe, axial image # 22/2, stable since prior examination and better characterized as a simple cyst on prior MRI of 04/21/2020. Liver and gallbladder are otherwise unremarkable. No intra or extrahepatic biliary ductal dilation peer Pancreas: Unremarkable Spleen: Unremarkable Adrenals/Urinary Tract: The adrenal glands are unremarkable. The right kidney is normal in size and position in contains a few stable simple cortical cysts. No hydronephrosis on the right. No intrarenal or ureteral calculi on the right. There has been interval left nephrectomy with a small amount of poorly circumscribed, bland appearing fluid within the resection bed along with a few punctate foci of gas. Gas is seen within the bladder lumen, possibly related to recent catheterization. The bladder is otherwise unremarkable. Stomach/Bowel: The stomach is unremarkable. Multiple mildly dilated loops of gas and fluid-filled small bowel are seen involving the mid jejunum with a focal  point of transition identified within the mid abdomen axial image # 47 and coronal image # 44 in keeping with a a distal partial or developing complete small bowel obstruction. There is fluid seen throughout the colon and rectum, a finding that may present clinically as diarrhea. Mild free intraperitoneal gas and extensive gas within the a body wall likely reflects the sequela of recent nephrectomy. No free intraperitoneal fluid. Vascular/Lymphatic: The abdominal vasculature is age-appropriate with mild aortoiliac atherosclerotic calcification. No aneurysm. There is no pathologic adenopathy within the abdomen and pelvis. Reproductive: Mild central prostatic enlargement indents the base of the bladder. Seminal vesicles are unremarkable. Other: Small fat containing left inguinal hernia noted. Musculoskeletal: No lytic or blastic bone lesions are seen. IMPRESSION: Distal partial or developing complete small bowel obstruction. Interval left nephrectomy. Extensive body wall subcutaneous gas and small free intraperitoneal gas is likely postsurgical in nature. Aortic Atherosclerosis (ICD10-I70.0). Electronically Signed   By: Fidela Salisbury MD   On: 06/25/2020 22:30   DG Abdomen Acute W/Chest  Result Date: 06/25/2020 CLINICAL DATA:  Abdominal pain, recent left nephrectomy, vomiting EXAM: ACUTE ABDOMEN SERIES (2 VIEW ABDOMEN AND 1 VIEW CHEST) COMPARISON:  06/23/2020 FINDINGS: Supine and upright frontal views of the abdomen as well as an upright frontal view of the chest are obtained. The cardiac silhouette is unremarkable. There is diffuse interstitial prominence throughout the lungs. Patchy consolidation is noted at the right lung base, which could be airspace disease or atelectasis. There is free gas below the right hemidiaphragm. There is progressive small bowel dilatation consistent with obstruction. Maximal diameter measures 4.2 cm. There are no abdominal masses or abnormal calcifications. Subcutaneous gas is seen  within the left flank consistent with recent nephrectomy. No acute bony abnormalities. IMPRESSION: 1. Progressive small bowel dilatation consistent with small-bowel obstruction. 2. Free gas within the right upper abdomen as above, nonspecific given recent  surgical intervention. This would have to be viewed with some suspicion given progressive small bowel distension. CT abdomen and pelvis with IV and oral contrast may be useful for further evaluation. 3. Patchy consolidation at the right lung base which may reflect atelectasis or airspace disease. Critical Value/emergent results were called by telephone at the time of interpretation on 06/25/2020 at 8:37 pm to provider Zaley Talley , who verbally acknowledged these results. Electronically Signed   By: Randa Ngo M.D.   On: 06/25/2020 20:36    Procedures Procedures (including critical care time)  Medications Ordered in ED Medications  ondansetron (ZOFRAN) injection 4 mg (4 mg Intravenous Not Given 06/25/20 2059)  sodium chloride 0.9 % bolus 1,000 mL (1,000 mLs Intravenous New Bag/Given 06/25/20 2057)  morphine 4 MG/ML injection 4 mg (4 mg Intravenous Given 06/25/20 2101)  promethazine (PHENERGAN) injection 25 mg (25 mg Intravenous Given 06/25/20 2113)  iohexol (OMNIPAQUE) 300 MG/ML solution 100 mL (75 mLs Intravenous Contrast Given 06/25/20 2155)    ED Course  I have reviewed the triage vital signs and the nursing notes.  Pertinent labs & imaging results that were available during my care of the patient were reviewed by me and considered in my medical decision making (see chart for details).    MDM Rules/Calculators/A&P                          Pt d/w Dr. Sheppard Coil (urology) who will see pt in the ED and likely admit.  She wants to hold off on NG until she sees patient.  Pain and nausea are improved after meds and fluids.  Final Clinical Impression(s) / ED Diagnoses Final diagnoses:  SBO (small bowel obstruction) ([place])    Rx / DC  Orders ED Discharge Orders    None       Isla Pence, MD 06/25/20 2253

## 2020-06-25 NOTE — ED Triage Notes (Signed)
Pt sts issues after having left kidney removed Friday. Constant vomiting, diarrhea and no appetite. Hx cancer but patient is unsure of what kind.

## 2020-06-25 NOTE — H&P (Addendum)
H&P  Chief Complaint: Nausea/emesis  History of Present Illness: Bruce Douglas is a 59 y.o. male who is POD 5 s/p robotic left radical nephrectomy with Dr. Lovena Neighbours for left renal mass (path: 2.3cm pT1a ccRCC, G2; appears to have been discussed with patient prior to DC) with post-operative course complicated by nausea/emesis and ultimate discharge on POD 4 after having return of bowel function who presents to the ED tonight for persistent nausea/emesis and diarrhea at home.   He reports eating a hamburger and pot roast on POD1, but he denies any significant intake of solid food since. He had a couple bites of food on day of discharge with a couple liquid bowel movements. Since discharge, he reports being unable to keep anything down, including pain and anti-emetic medications. He also reports liquid bowel movements that are "like water." He reports ambulating minimally due to weakness. Minimal urine output. No fevers/chills.   He is AFHDS in the ED. His Cr is 1.5, which is stable from discharge Cr. Normal electrolytes. No leukocytosis. Stable Hgb. Received 1L NS bolus in ED. CT A/P with contrast demonstrates distal partial vs developing complete small bowel obstruction.   Past Medical History:  Diagnosis Date  . Anxiety   . Arthritis    hands  . Asthma 06/04/2020  . Cancer ([place])   . Depression   . GERD (gastroesophageal reflux disease)   . History of kidney stones   . Hypertension   . Hypothyroidism     Past Surgical History:  Procedure Laterality Date  . [place]  . ROBOT ASSISTED LAPAROSCOPIC NEPHRECTOMY Left 06/20/2020   Procedure: XI ROBOTIC ASSISTED LAPAROSCOPIC RADICAL NEPHRECTOMY;  Surgeon: Ceasar Mons, MD;  Location: [facility];  Service: Urology;  Laterality: Left;    Home Medications:  No current facility-administered medications on file prior to encounter.   Current Outpatient Medications on File Prior to Encounter  Medication Sig Dispense Refill   . acetaminophen (TYLENOL) 500 MG tablet Take 1,000 mg by mouth in the morning and at bedtime. Take with celecoxib    . amLODipine (NORVASC) 5 MG tablet Take 5 mg by mouth at bedtime.     Marland Kitchen atenolol (TENORMIN) 25 MG tablet Take 25 mg by mouth at bedtime.     Marland Kitchen buPROPion (WELLBUTRIN XL) 300 MG 24 hr tablet Take 300 mg by mouth at bedtime.     . cyclobenzaprine (FLEXERIL) 10 MG tablet Take 10 mg by mouth See admin instructions. Take 1 tablet (10 mg) by mouth scheduled at bedtime, may take an additional dose during the day if needed for back exertion.    . docusate sodium (COLACE) 100 MG capsule Take 1 capsule (100 mg total) by mouth 2 (two) times daily. 10 capsule   . doxazosin (CARDURA) 4 MG tablet Take 4 mg by mouth at bedtime.    Marland Kitchen HYDROcodone-acetaminophen (NORCO) 5-325 MG tablet Take 1-2 tablets by mouth every 6 (six) hours as needed for moderate pain. 20 tablet 0  . levothyroxine (SYNTHROID) 137 MCG tablet Take 137 mcg by mouth at bedtime.     Marland Kitchen omeprazole (PRILOSEC) 20 MG capsule Take 40 mg by mouth at bedtime.     . promethazine (PHENERGAN) 12.5 MG tablet Take 1 tablet (12.5 mg total) by mouth every 4 (four) hours as needed for nausea or vomiting. 15 tablet 0  . QUEtiapine (SEROQUEL) 50 MG tablet Take 50 mg by mouth at bedtime.     . varenicline (CHANTIX) 1 MG tablet Take 1  mg by mouth 2 (two) times daily.       Allergies: No Known Allergies  No family history on file.  Social History:  reports that he has been smoking cigarettes. He has a 3.75 pack-year smoking history. He has never used smokeless tobacco. He reports previous alcohol use. He reports previous drug use.  ROS: A complete review of systems was performed.  All systems are negative except for pertinent findings as noted.  Physical Exam:  Vital signs in last 24 hours: Temp:  [99 F (37.2 C)] 99 F (37.2 C) (09/22 1943) Pulse Rate:  [94] 94 (09/22 1943) Resp:  [21] 21 (09/22 1943) BP: (124)/(83) 124/83 (09/22  1943) SpO2:  [98 %] 98 % (09/22 1943) Weight:  [29 kg] 67 kg (09/22 1944) Constitutional:  Alert and oriented, appears uncomfortable, thin Cardiovascular: Regular rate Respiratory: Normal respiratory effort on RA GI: Abdomen is soft, nontender, moderately distended. Incision clean/dry/intact without signs of infection Neurologic: Grossly intact, no focal deficits Psychiatric: Normal mood and affect   Laboratory Data:  Recent Labs    06/23/20 0445 06/25/20 2029  WBC  --  4.5  HGB 13.5 13.4  HCT 40.2 37.2*  PLT  --  271    Recent Labs    06/23/20 0445 06/25/20 2029  NA 138 137  K 3.9 3.6  CL 104 105  GLUCOSE 121* 116*  BUN 27* 34*  CALCIUM 8.9 8.7*  CREATININE 1.59* 1.51*     Results for orders placed or performed during the hospital encounter of 06/25/20 (from the past 24 hour(s))  CBC with Differential     Status: Abnormal   Collection Time: 06/25/20  8:29 PM  Result Value Ref Range   WBC 4.5 4.0 - 10.5 K/uL   RBC 4.00 (L) 4.22 - 5.81 MIL/uL   Hemoglobin 13.4 13.0 - 17.0 g/dL   HCT 37.2 (L) 39 - 52 %   MCV 93.0 80.0 - 100.0 fL   MCH 33.5 26.0 - 34.0 pg   MCHC 36.0 30.0 - 36.0 g/dL   RDW 12.4 11.5 - 15.5 %   Platelets 271 150 - 400 K/uL   nRBC 0.0 0.0 - 0.2 %   Neutrophils Relative % 45 %   Neutro Abs 2.0 1.7 - 7.7 K/uL   Lymphocytes Relative 24 %   Lymphs Abs 1.1 0.7 - 4.0 K/uL   Monocytes Relative 28 %   Monocytes Absolute 1.3 (H) 0 - 1 K/uL   Eosinophils Relative 2 %   Eosinophils Absolute 0.1 0 - 0 K/uL   Basophils Relative 0 %   Basophils Absolute 0.0 0 - 0 K/uL   WBC Morphology MILD LEFT SHIFT (1-5% METAS, OCC MYELO, OCC BANDS)    Immature Granulocytes 1 %   Abs Immature Granulocytes 0.03 0.00 - 0.07 K/uL  Comprehensive metabolic panel     Status: Abnormal   Collection Time: 06/25/20  8:29 PM  Result Value Ref Range   Sodium 137 135 - 145 mmol/L   Potassium 3.6 3.5 - 5.1 mmol/L   Chloride 105 98 - 111 mmol/L   CO2 21 (L) 22 - 32 mmol/L    Glucose, Bld 116 (H) 70 - 99 mg/dL   BUN 34 (H) 6 - 20 mg/dL   Creatinine, Ser 1.51 (H) 0.61 - 1.24 mg/dL   Calcium 8.7 (L) 8.9 - 10.3 mg/dL   Total Protein 7.2 6.5 - 8.1 g/dL   Albumin 3.6 3.5 - 5.0 g/dL   AST 22 15 -  41 U/L   ALT 21 0 - 44 U/L   Alkaline Phosphatase 65 38 - 126 U/L   Total Bilirubin 1.2 0.3 - 1.2 mg/dL   GFR calc non Af Amer 50 (L) >60 mL/min   GFR calc Af Amer 58 (L) >60 mL/min   Anion gap 11 5 - 15  Lipase, blood     Status: Abnormal   Collection Time: 06/25/20  8:29 PM  Result Value Ref Range   Lipase 67 (H) 11 - 51 U/L   Recent Results (from the past 240 hour(s))  SARS CORONAVIRUS 2 (TAT 6-24 HRS) Nasopharyngeal Nasopharyngeal Swab     Status: None   Collection Time: 06/17/20  1:34 PM   Specimen: Nasopharyngeal Swab  Result Value Ref Range Status   SARS Coronavirus 2 NEGATIVE NEGATIVE Final    Comment: (NOTE) SARS-CoV-2 target nucleic acids are NOT DETECTED.  The SARS-CoV-2 RNA is generally detectable in upper and lower respiratory specimens during the acute phase of infection. Negative results do not preclude SARS-CoV-2 infection, do not rule out co-infections with other pathogens, and should not be used as the sole basis for treatment or other patient management decisions. Negative results must be combined with clinical observations, patient history, and epidemiological information. The expected result is Negative.  Fact Sheet for Patients: SugarRoll.be  Fact Sheet for Healthcare Providers: https://www.woods-mathews.com/  This test is not yet approved or cleared by the [place] FDA and  has been authorized for detection and/or diagnosis of SARS-CoV-2 by FDA under an Emergency Use Authorization (EUA). This EUA will remain  in effect (meaning this test can be used) for the duration of the COVID-19 declaration under Se ction 564(b)(1) of the Act, 21 U.S.C. section 360bbb-3(b)(1), unless the authorization  is terminated or revoked sooner.  Performed at [facility], [place] [address], [place], [place] [address]     Renal Function: Recent Labs    06/21/20 0603 06/23/20 0445 06/25/20 2029  CREATININE 1.52* 1.59* 1.51*   Estimated Creatinine Clearance: 49.9 mL/min (A) (by C-G formula based on SCr of 1.51 mg/dL (H)).  Radiologic Imaging: CT ABDOMEN PELVIS W CONTRAST  Result Date: 06/25/2020 CLINICAL DATA:  Unspecified abdominal pain, vomiting, diarrhea. Status post left nephrectomy. EXAM: CT ABDOMEN AND PELVIS WITH CONTRAST TECHNIQUE: Multidetector CT imaging of the abdomen and pelvis was performed using the standard protocol following bolus administration of intravenous contrast. CONTRAST:  22mL OMNIPAQUE IOHEXOL 300 MG/ML  SOLN COMPARISON:  10/26/2018 FINDINGS: Lower chest: There is discoid atelectasis involving the posterior basal lower lobes bilaterally. Trace bilateral pleural effusions, left greater than right. The visualized heart and pericardium are unremarkable. Hepatobiliary: 9 mm hypodensity is seen within the right hepatic lobe, axial image # 22/2, stable since prior examination and better characterized as a simple cyst on prior MRI of 04/21/2020. Liver and gallbladder are otherwise unremarkable. No intra or extrahepatic biliary ductal dilation peer Pancreas: Unremarkable Spleen: Unremarkable Adrenals/Urinary Tract: The adrenal glands are unremarkable. The right kidney is normal in size and position in contains a few stable simple cortical cysts. No hydronephrosis on the right. No intrarenal or ureteral calculi on the right. There has been interval left nephrectomy with a small amount of poorly circumscribed, bland appearing fluid within the resection bed along with a few punctate foci of gas. Gas is seen within the bladder lumen, possibly related to recent catheterization. The bladder is otherwise unremarkable. Stomach/Bowel: The stomach is unremarkable. Multiple mildly dilated loops  of gas and fluid-filled small bowel  are seen involving the mid jejunum with a focal point of transition identified within the mid abdomen axial image # 51 and coronal image # 44 in keeping with a a distal partial or developing complete small bowel obstruction. There is fluid seen throughout the colon and rectum, a finding that may present clinically as diarrhea. Mild free intraperitoneal gas and extensive gas within the a body wall likely reflects the sequela of recent nephrectomy. No free intraperitoneal fluid. Vascular/Lymphatic: The abdominal vasculature is age-appropriate with mild aortoiliac atherosclerotic calcification. No aneurysm. There is no pathologic adenopathy within the abdomen and pelvis. Reproductive: Mild central prostatic enlargement indents the base of the bladder. Seminal vesicles are unremarkable. Other: Small fat containing left inguinal hernia noted. Musculoskeletal: No lytic or blastic bone lesions are seen. IMPRESSION: Distal partial or developing complete small bowel obstruction. Interval left nephrectomy. Extensive body wall subcutaneous gas and small free intraperitoneal gas is likely postsurgical in nature. Aortic Atherosclerosis (ICD10-I70.0). Electronically Signed   By: Fidela Salisbury MD   On: 06/25/2020 22:30   DG Abdomen Acute W/Chest  Result Date: 06/25/2020 CLINICAL DATA:  Abdominal pain, recent left nephrectomy, vomiting EXAM: ACUTE ABDOMEN SERIES (2 VIEW ABDOMEN AND 1 VIEW CHEST) COMPARISON:  06/23/2020 FINDINGS: Supine and upright frontal views of the abdomen as well as an upright frontal view of the chest are obtained. The cardiac silhouette is unremarkable. There is diffuse interstitial prominence throughout the lungs. Patchy consolidation is noted at the right lung base, which could be airspace disease or atelectasis. There is free gas below the right hemidiaphragm. There is progressive small bowel dilatation consistent with obstruction. Maximal diameter measures 4.2 cm.  There are no abdominal masses or abnormal calcifications. Subcutaneous gas is seen within the left flank consistent with recent nephrectomy. No acute bony abnormalities. IMPRESSION: 1. Progressive small bowel dilatation consistent with small-bowel obstruction. 2. Free gas within the right upper abdomen as above, nonspecific given recent surgical intervention. This would have to be viewed with some suspicion given progressive small bowel distension. CT abdomen and pelvis with IV and oral contrast may be useful for further evaluation. 3. Patchy consolidation at the right lung base which may reflect atelectasis or airspace disease. Critical Value/emergent results were called by telephone at the time of interpretation on 06/25/2020 at 8:37 pm to provider JULIE HAVILAND , who verbally acknowledged these results. Electronically Signed   By: Randa Ngo M.D.   On: 06/25/2020 20:36    Impression/Assessment:  Sir Mallis is a 59 y.o. male who is POD 5 s/p robotic left radical nephrectomy with Dr. Lovena Neighbours for left renal mass (path: 2.3cm pT1a ccRCC, G2) who presents to the ED with nausea/emesis and imaging c/w partial versus developing complete SBO.   Plan:  - Admit to Urology - Place NGT with subsequent KUB to confirm position - NPO, mIVF - Surgery consult in am - Daily labs - PRN analgesics, PRN anti-emetics - Continue home meds. Will ask for pharmacy's assistance to substitute IV versions where able - Lovenox for DVT ppx, SCDs, IS - Consider TPN in the near future given minimal nutrition postop    Celene Squibb 06/25/2020, 11:03 PM   Agree with above. Briefly, 28yM POD5 s/p L RALNx with Dr. Lovena Neighbours for left renal mass with path resulting pT1a ccRCC G2 with ileus vs SBO. Since discharge 9/21, unable to tolerate food and having N/V. Has had some liquid BM since discharge, but nothing since. Last flatus early AM 9/22.   Afebrile, hemodynamically appropriate Abd soft,  mildly distended, non-tender,  no peritoneal findings  CT A/P 9/22 with distal partial or developing complete SBO. Mildly dilated loops of gas SB with transition point identified in mid abdomen.  -NPO/NGT. May need to start TPN by POD7. -Will engage general surgery -May benefit from small bowel series today if able to tolerate  Matt R. [facility]  Pager: [phone]

## 2020-06-26 ENCOUNTER — Inpatient Hospital Stay (HOSPITAL_COMMUNITY): Payer: 59

## 2020-06-26 LAB — BASIC METABOLIC PANEL
Anion gap: 11 (ref 5–15)
BUN: 31 mg/dL — ABNORMAL HIGH (ref 6–20)
CO2: 23 mmol/L (ref 22–32)
Calcium: 8.6 mg/dL — ABNORMAL LOW (ref 8.9–10.3)
Chloride: 104 mmol/L (ref 98–111)
Creatinine, Ser: 1.46 mg/dL — ABNORMAL HIGH (ref 0.61–1.24)
GFR calc Af Amer: >60 mL/min (ref >60)
GFR calc non Af Amer: 52 mL/min — ABNORMAL LOW (ref >60)
Glucose, Bld: 106 mg/dL — ABNORMAL HIGH (ref 70–99)
Potassium: 3.8 mmol/L (ref 3.5–5.1)
Sodium: 138 mmol/L (ref 135–145)

## 2020-06-26 LAB — URINALYSIS, ROUTINE W REFLEX MICROSCOPIC
Bacteria, UA: NONE SEEN
Bilirubin Urine: NEGATIVE
Glucose, UA: NEGATIVE mg/dL
Ketones, ur: 20 mg/dL — AB
Leukocytes,Ua: NEGATIVE
Nitrite: NEGATIVE
Protein, ur: 30 mg/dL — AB
Specific Gravity, Urine: 1.034 — ABNORMAL HIGH (ref 1.005–1.030)
pH: 5 (ref 5.0–8.0)

## 2020-06-26 LAB — HEMOGLOBIN AND HEMATOCRIT, BLOOD
HCT: 37.1 % — ABNORMAL LOW (ref 39.0–52.0)
Hemoglobin: 12.4 g/dL — ABNORMAL LOW (ref 13.0–17.0)

## 2020-06-26 LAB — SARS CORONAVIRUS 2 BY RT PCR (HOSPITAL ORDER, PERFORMED IN ~~LOC~~ HOSPITAL LAB): SARS Coronavirus 2: NEGATIVE

## 2020-06-26 LAB — HIV ANTIBODY (ROUTINE TESTING W REFLEX): HIV Screen 4th Generation wRfx: NONREACTIVE

## 2020-06-26 MED ORDER — NYSTATIN 100000 UNIT/ML MT SUSP
5.0000 mL | Freq: Four times a day (QID) | OROMUCOSAL | Status: DC
  Administered 2020-06-26 – 2020-07-03 (×28): 500000 [IU] via ORAL
  Filled 2020-06-26 (×28): qty 5

## 2020-06-26 MED ORDER — MAGIC MOUTHWASH W/LIDOCAINE
5.0000 mL | Freq: Four times a day (QID) | ORAL | Status: DC
  Administered 2020-06-26 – 2020-06-30 (×17): 5 mL via ORAL
  Filled 2020-06-26 (×30): qty 5

## 2020-06-26 MED ORDER — PHENOL 1.4 % MT LIQD
1.0000 | OROMUCOSAL | Status: DC | PRN
  Administered 2020-06-26: 1 via OROMUCOSAL
  Filled 2020-06-26: qty 177

## 2020-06-26 MED ORDER — PANTOPRAZOLE SODIUM 40 MG IV SOLR
40.0000 mg | Freq: Every day | INTRAVENOUS | Status: DC
  Administered 2020-06-26 – 2020-06-28 (×4): 40 mg via INTRAVENOUS
  Filled 2020-06-26 (×4): qty 40

## 2020-06-26 MED ORDER — LEVOTHYROXINE SODIUM 100 MCG/5ML IV SOLN: 68.5000 ug | Freq: Every day | INTRAVENOUS | Status: DC

## 2020-06-26 NOTE — Plan of Care (Signed)
  Problem: Clinical Measurements: Goal: Ability to maintain clinical measurements within normal limits will improve Outcome: Progressing Goal: Will remain free from infection Outcome: Progressing Goal: Diagnostic test results will improve Outcome: Progressing Goal: Respiratory complications will improve Outcome: Progressing Goal: Cardiovascular complication will be avoided Outcome: Progressing   Problem: Activity: Goal: Risk for activity intolerance will decrease Outcome: Progressing   Problem: Coping: Goal: Level of anxiety will decrease Outcome: Progressing   Problem: Elimination: Goal: Will not experience complications related to bowel motility Outcome: Progressing   Problem: Pain Managment: Goal: General experience of comfort will improve Outcome: Progressing   Problem: Safety: Goal: Ability to remain free from injury will improve Outcome: Progressing   Problem: Skin Integrity: Goal: Risk for impaired skin integrity will decrease Outcome: Progressing

## 2020-06-26 NOTE — Consult Note (Signed)
CC: nausea, vomiting  Requesting provider: Dr Rexene Alberts  HPI: Bruce Douglas is an 59 y.o. male who is here for SBO. He is POD 5 s/p robotic left radical nephrectomy with Dr. Lovena Neighbours for left renal mass with post-operative course complicated by nausea/emesis and discharge on POD 4 after having return of bowel function (diarrhea).  He presented to the ED last night with persistent nausea/emesis and diarrhea at home.   Past Medical History:  Diagnosis Date  . Anxiety   . Arthritis    hands  . Asthma 06/04/2020  . Cancer ([place])   . Depression   . GERD (gastroesophageal reflux disease)   . History of kidney stones   . Hypertension   . Hypothyroidism     Past Surgical History:  Procedure Laterality Date  . [facility]  . ROBOT ASSISTED LAPAROSCOPIC NEPHRECTOMY Left 06/20/2020   Procedure: XI ROBOTIC ASSISTED LAPAROSCOPIC RADICAL NEPHRECTOMY;  Surgeon: Ceasar Mons, MD;  Location: [facility];  Service: Urology;  Laterality: Left;    No family history on file.  Social:  reports that he has been smoking cigarettes. He has a 3.75 pack-year smoking history. He has never used smokeless tobacco. He reports previous alcohol use. He reports previous drug use.  Allergies: No Known Allergies  Medications: I have reviewed the patient's current medications.  Results for orders placed or performed during the hospital encounter of 06/25/20 (from the past 48 hour(s))  CBC with Differential     Status: Abnormal   Collection Time: 06/25/20  8:29 PM  Result Value Ref Range   WBC 4.5 4.0 - 10.5 K/uL   RBC 4.00 (L) 4.22 - 5.81 MIL/uL   Hemoglobin 13.4 13.0 - 17.0 g/dL   HCT 37.2 (L) 39 - 52 %   MCV 93.0 80.0 - 100.0 fL   MCH 33.5 26.0 - 34.0 pg   MCHC 36.0 30.0 - 36.0 g/dL   RDW 12.4 11.5 - 15.5 %   Platelets 271 150 - 400 K/uL   nRBC 0.0 0.0 - 0.2 %   Neutrophils Relative % 45 %   Neutro Abs 2.0 1.7 - 7.7 K/uL   Lymphocytes Relative 24 %   Lymphs Abs 1.1 0.7 - 4.0  K/uL   Monocytes Relative 28 %   Monocytes Absolute 1.3 (H) 0 - 1 K/uL   Eosinophils Relative 2 %   Eosinophils Absolute 0.1 0 - 0 K/uL   Basophils Relative 0 %   Basophils Absolute 0.0 0 - 0 K/uL   WBC Morphology MILD LEFT SHIFT (1-5% METAS, OCC MYELO, OCC BANDS)    Immature Granulocytes 1 %   Abs Immature Granulocytes 0.03 0.00 - 0.07 K/uL    Comment: Performed at [facility], [place] [address], [place], [place] [address]  Comprehensive metabolic panel     Status: Abnormal   Collection Time: 06/25/20  8:29 PM  Result Value Ref Range   Sodium 137 135 - 145 mmol/L   Potassium 3.6 3.5 - 5.1 mmol/L   Chloride 105 98 - 111 mmol/L   CO2 21 (L) 22 - 32 mmol/L   Glucose, Bld 116 (H) 70 - 99 mg/dL    Comment: Glucose reference range applies only to samples taken after fasting for at least 8 hours.   BUN 34 (H) 6 - 20 mg/dL   Creatinine, Ser 1.51 (H) 0.61 - 1.24 mg/dL   Calcium 8.7 (L) 8.9 - 10.3 mg/dL   Total Protein 7.2 6.5 - 8.1 g/dL  Albumin 3.6 3.5 - 5.0 g/dL   AST 22 15 - 41 U/L   ALT 21 0 - 44 U/L   Alkaline Phosphatase 65 38 - 126 U/L   Total Bilirubin 1.2 0.3 - 1.2 mg/dL   GFR calc non Af Amer 50 (L) >60 mL/min   GFR calc Af Amer 58 (L) >60 mL/min   Anion gap 11 5 - 15    Comment: Performed at [facility], [place] [address], [place], [place] [address]  Lipase, blood     Status: Abnormal   Collection Time: 06/25/20  8:29 PM  Result Value Ref Range   Lipase 67 (H) 11 - 51 U/L    Comment: Performed at [facility], [place] [address] [place], [place] [address]  SARS Coronavirus 2 by RT PCR (hospital order, performed in [facility] hospital lab) Nasopharyngeal Nasopharyngeal Swab     Status: None   Collection Time: 06/25/20 11:08 PM   Specimen: Nasopharyngeal Swab  Result Value Ref Range   SARS Coronavirus 2 NEGATIVE NEGATIVE    Comment: (NOTE) SARS-CoV-2 target nucleic acids are NOT DETECTED.  The SARS-CoV-2 RNA is  generally detectable in upper and lower respiratory specimens during the acute phase of infection. The lowest concentration of SARS-CoV-2 viral copies this assay can detect is 250 copies / mL. A negative result does not preclude SARS-CoV-2 infection and should not be used as the sole basis for treatment or other patient management decisions.  A negative result may occur with improper specimen collection / handling, submission of specimen other than nasopharyngeal swab, presence of viral mutation(s) within the areas targeted by this assay, and inadequate number of viral copies (<250 copies / mL). A negative result must be combined with clinical observations, patient history, and epidemiological information.  Fact Sheet for Patients:   StrictlyIdeas.no  Fact Sheet for Healthcare Providers: BankingDealers.co.za  This test is not yet approved or  cleared by the [place] FDA and has been authorized for detection and/or diagnosis of SARS-CoV-2 by FDA under an Emergency Use Authorization (EUA).  This EUA will remain in effect (meaning this test can be used) for the duration of the COVID-19 declaration under Section 564(b)(1) of the Act, 21 U.S.C. section 360bbb-3(b)(1), unless the authorization is terminated or revoked sooner.  Performed at [facility], [place] [address] [place], [place] [address]     DG Abd 1 View  Result Date: 06/26/2020 CLINICAL DATA:  Check gastric catheter placement EXAM: ABDOMEN - 1 VIEW COMPARISON:  None. FINDINGS: Gastric catheter is noted within the stomach. Proximal side port is noted at the gastroesophageal junction. This should be advanced several cm deeper into the stomach. Multiple dilated loops of small bowel are noted consistent with the prior CT findings. IMPRESSION: Gastric catheter within the stomach although it should be advanced several cm to allow for the proximal side port to gain  access to the gastric lumen. Multiple dilated loops of small bowel consistent with small-bowel obstruction seen on prior CT. Electronically Signed   By: Inez Catalina M.D.   On: 06/26/2020 02:41   CT ABDOMEN PELVIS W CONTRAST  Result Date: 06/25/2020 CLINICAL DATA:  Unspecified abdominal pain, vomiting, diarrhea. Status post left nephrectomy. EXAM: CT ABDOMEN AND PELVIS WITH CONTRAST TECHNIQUE: Multidetector CT imaging of the abdomen and pelvis was performed using the standard protocol following bolus administration of intravenous contrast. CONTRAST:  29mL OMNIPAQUE IOHEXOL 300 MG/ML  SOLN COMPARISON:  10/26/2018 FINDINGS: Lower chest: There is  discoid atelectasis involving the posterior basal lower lobes bilaterally. Trace bilateral pleural effusions, left greater than right. The visualized heart and pericardium are unremarkable. Hepatobiliary: 9 mm hypodensity is seen within the right hepatic lobe, axial image # 22/2, stable since prior examination and better characterized as a simple cyst on prior MRI of 04/21/2020. Liver and gallbladder are otherwise unremarkable. No intra or extrahepatic biliary ductal dilation peer Pancreas: Unremarkable Spleen: Unremarkable Adrenals/Urinary Tract: The adrenal glands are unremarkable. The right kidney is normal in size and position in contains a few stable simple cortical cysts. No hydronephrosis on the right. No intrarenal or ureteral calculi on the right. There has been interval left nephrectomy with a small amount of poorly circumscribed, bland appearing fluid within the resection bed along with a few punctate foci of gas. Gas is seen within the bladder lumen, possibly related to recent catheterization. The bladder is otherwise unremarkable. Stomach/Bowel: The stomach is unremarkable. Multiple mildly dilated loops of gas and fluid-filled small bowel are seen involving the mid jejunum with a focal point of transition identified within the mid abdomen axial image # 47 and  coronal image # 44 in keeping with a a distal partial or developing complete small bowel obstruction. There is fluid seen throughout the colon and rectum, a finding that may present clinically as diarrhea. Mild free intraperitoneal gas and extensive gas within the a body wall likely reflects the sequela of recent nephrectomy. No free intraperitoneal fluid. Vascular/Lymphatic: The abdominal vasculature is age-appropriate with mild aortoiliac atherosclerotic calcification. No aneurysm. There is no pathologic adenopathy within the abdomen and pelvis. Reproductive: Mild central prostatic enlargement indents the base of the bladder. Seminal vesicles are unremarkable. Other: Small fat containing left inguinal hernia noted. Musculoskeletal: No lytic or blastic bone lesions are seen. IMPRESSION: Distal partial or developing complete small bowel obstruction. Interval left nephrectomy. Extensive body wall subcutaneous gas and small free intraperitoneal gas is likely postsurgical in nature. Aortic Atherosclerosis (ICD10-I70.0). Electronically Signed   By: Fidela Salisbury MD   On: 06/25/2020 22:30   DG Abdomen Acute W/Chest  Result Date: 06/25/2020 CLINICAL DATA:  Abdominal pain, recent left nephrectomy, vomiting EXAM: ACUTE ABDOMEN SERIES (2 VIEW ABDOMEN AND 1 VIEW CHEST) COMPARISON:  06/23/2020 FINDINGS: Supine and upright frontal views of the abdomen as well as an upright frontal view of the chest are obtained. The cardiac silhouette is unremarkable. There is diffuse interstitial prominence throughout the lungs. Patchy consolidation is noted at the right lung base, which could be airspace disease or atelectasis. There is free gas below the right hemidiaphragm. There is progressive small bowel dilatation consistent with obstruction. Maximal diameter measures 4.2 cm. There are no abdominal masses or abnormal calcifications. Subcutaneous gas is seen within the left flank consistent with recent nephrectomy. No acute bony  abnormalities. IMPRESSION: 1. Progressive small bowel dilatation consistent with small-bowel obstruction. 2. Free gas within the right upper abdomen as above, nonspecific given recent surgical intervention. This would have to be viewed with some suspicion given progressive small bowel distension. CT abdomen and pelvis with IV and oral contrast may be useful for further evaluation. 3. Patchy consolidation at the right lung base which may reflect atelectasis or airspace disease. Critical Value/emergent results were called by telephone at the time of interpretation on 06/25/2020 at 8:37 pm to provider JULIE HAVILAND , who verbally acknowledged these results. Electronically Signed   By: Randa Ngo M.D.   On: 06/25/2020 20:36    ROS - all of the below systems have  been reviewed with the patient and positives are indicated with bold text General: chills, fever or night sweats Eyes: blurry vision or double vision ENT: epistaxis or sore throat Allergy/Immunology: itchy/watery eyes or nasal congestion Hematologic/Lymphatic: bleeding problems, blood clots or swollen lymph nodes Endocrine: temperature intolerance or unexpected weight changes Breast: new or changing breast lumps or nipple discharge Resp: cough, shortness of breath, or wheezing CV: chest pain or dyspnea on exertion GI: as per HPI GU: dysuria, trouble voiding, or hematuria MSK: joint pain or joint stiffness Neuro: TIA or stroke symptoms Derm: pruritus and skin lesion changes Psych: anxiety and depression  PE Blood pressure 117/90, pulse 78, temperature 98.6 F (37 C), temperature source Oral, resp. rate 19, height 5\' 10"  (1.778 m), weight 67 kg, SpO2 94 %. Constitutional: NAD; conversant; no deformities Eyes: Moist conjunctiva; no lid lag; anicteric; PERRL Neck: Trachea midline; no thyromegaly Lungs: Normal respiratory effort; no tactile fremitus CV: RRR; no palpable thrills; no pitting edema GI: Abd soft, mildly distended; no  palpable hepatosplenomegaly MSK: Normal range of motion of extremities; no clubbing/cyanosis Psychiatric: Appropriate affect; alert and oriented x3 Lymphatic: No palpable cervical or axillary lymphadenopathy  Results for orders placed or performed during the hospital encounter of 06/25/20 (from the past 48 hour(s))  CBC with Differential     Status: Abnormal   Collection Time: 06/25/20  8:29 PM  Result Value Ref Range   WBC 4.5 4.0 - 10.5 K/uL   RBC 4.00 (L) 4.22 - 5.81 MIL/uL   Hemoglobin 13.4 13.0 - 17.0 g/dL   HCT 37.2 (L) 39 - 52 %   MCV 93.0 80.0 - 100.0 fL   MCH 33.5 26.0 - 34.0 pg   MCHC 36.0 30.0 - 36.0 g/dL   RDW 12.4 11.5 - 15.5 %   Platelets 271 150 - 400 K/uL   nRBC 0.0 0.0 - 0.2 %   Neutrophils Relative % 45 %   Neutro Abs 2.0 1.7 - 7.7 K/uL   Lymphocytes Relative 24 %   Lymphs Abs 1.1 0.7 - 4.0 K/uL   Monocytes Relative 28 %   Monocytes Absolute 1.3 (H) 0 - 1 K/uL   Eosinophils Relative 2 %   Eosinophils Absolute 0.1 0 - 0 K/uL   Basophils Relative 0 %   Basophils Absolute 0.0 0 - 0 K/uL   WBC Morphology MILD LEFT SHIFT (1-5% METAS, OCC MYELO, OCC BANDS)    Immature Granulocytes 1 %   Abs Immature Granulocytes 0.03 0.00 - 0.07 K/uL    Comment: Performed at [facility], [place] [address] [place], [place] [address]  Comprehensive metabolic panel     Status: Abnormal   Collection Time: 06/25/20  8:29 PM  Result Value Ref Range   Sodium 137 135 - 145 mmol/L   Potassium 3.6 3.5 - 5.1 mmol/L   Chloride 105 98 - 111 mmol/L   CO2 21 (L) 22 - 32 mmol/L   Glucose, Bld 116 (H) 70 - 99 mg/dL    Comment: Glucose reference range applies only to samples taken after fasting for at least 8 hours.   BUN 34 (H) 6 - 20 mg/dL   Creatinine, Ser 1.51 (H) 0.61 - 1.24 mg/dL   Calcium 8.7 (L) 8.9 - 10.3 mg/dL   Total Protein 7.2 6.5 - 8.1 g/dL   Albumin 3.6 3.5 - 5.0 g/dL   AST 22 15 - 41 U/L   ALT 21 0 - 44 U/L   Alkaline Phosphatase 65 38 - 126  U/L   Total  Bilirubin 1.2 0.3 - 1.2 mg/dL   GFR calc non Af Amer 50 (L) >60 mL/min   GFR calc Af Amer 58 (L) >60 mL/min   Anion gap 11 5 - 15    Comment: Performed at [facility], [place] [address], [place], [place] [address]  Lipase, blood     Status: Abnormal   Collection Time: 06/25/20  8:29 PM  Result Value Ref Range   Lipase 67 (H) 11 - 51 U/L    Comment: Performed at [facility], [place] [address] [place], [place] [address]  SARS Coronavirus 2 by RT PCR (hospital order, performed [facility] hospital lab) Nasopharyngeal Nasopharyngeal Swab     Status: None   Collection Time: 06/25/20 11:08 PM   Specimen: Nasopharyngeal Swab  Result Value Ref Range   SARS Coronavirus 2 NEGATIVE NEGATIVE    Comment: (NOTE) SARS-CoV-2 target nucleic acids are NOT DETECTED.  The SARS-CoV-2 RNA is generally detectable in upper and lower respiratory specimens during the acute phase of infection. The lowest concentration of SARS-CoV-2 viral copies this assay can detect is 250 copies / mL. A negative result does not preclude SARS-CoV-2 infection and should not be used as the sole basis for treatment or other patient management decisions.  A negative result may occur with improper specimen collection / handling, submission of specimen other than nasopharyngeal swab, presence of viral mutation(s) within the areas targeted by this assay, and inadequate number of viral copies (<250 copies / mL). A negative result must be combined with clinical observations, patient history, and epidemiological information.  Fact Sheet for Patients:   StrictlyIdeas.no  Fact Sheet for Healthcare Providers: BankingDealers.co.za  This test is not yet approved or  cleared by the [place] FDA and has been authorized for detection and/or diagnosis of SARS-CoV-2 by FDA under an Emergency Use Authorization (EUA).  This EUA will remain in effect  (meaning this test can be used) for the duration of the COVID-19 declaration under Section 564(b)(1) of the Act, 21 U.S.C. section 360bbb-3(b)(1), unless the authorization is terminated or revoked sooner.  Performed at [facility], [place] [address] [place], [place] [address]     DG Abd 1 View  Result Date: 06/26/2020 CLINICAL DATA:  Check gastric catheter placement EXAM: ABDOMEN - 1 VIEW COMPARISON:  None. FINDINGS: Gastric catheter is noted within the stomach. Proximal side port is noted at the gastroesophageal junction. This should be advanced several cm deeper into the stomach. Multiple dilated loops of small bowel are noted consistent with the prior CT findings. IMPRESSION: Gastric catheter within the stomach although it should be advanced several cm to allow for the proximal side port to gain access to the gastric lumen. Multiple dilated loops of small bowel consistent with small-bowel obstruction seen on prior CT. Electronically Signed   By: Inez Catalina M.D.   On: 06/26/2020 02:41   CT ABDOMEN PELVIS W CONTRAST  Result Date: 06/25/2020 CLINICAL DATA:  Unspecified abdominal pain, vomiting, diarrhea. Status post left nephrectomy. EXAM: CT ABDOMEN AND PELVIS WITH CONTRAST TECHNIQUE: Multidetector CT imaging of the abdomen and pelvis was performed using the standard protocol following bolus administration of intravenous contrast. CONTRAST:  75mL OMNIPAQUE IOHEXOL 300 MG/ML  SOLN COMPARISON:  10/26/2018 FINDINGS: Lower chest: There is discoid atelectasis involving the posterior basal lower lobes bilaterally. Trace bilateral pleural effusions, left greater than right. The visualized heart and pericardium are unremarkable. Hepatobiliary: 9 mm hypodensity is seen  within the right hepatic lobe, axial image # 22/2, stable since prior examination and better characterized as a simple cyst on prior MRI of 04/21/2020. Liver and gallbladder are otherwise unremarkable. No intra or  extrahepatic biliary ductal dilation peer Pancreas: Unremarkable Spleen: Unremarkable Adrenals/Urinary Tract: The adrenal glands are unremarkable. The right kidney is normal in size and position in contains a few stable simple cortical cysts. No hydronephrosis on the right. No intrarenal or ureteral calculi on the right. There has been interval left nephrectomy with a small amount of poorly circumscribed, bland appearing fluid within the resection bed along with a few punctate foci of gas. Gas is seen within the bladder lumen, possibly related to recent catheterization. The bladder is otherwise unremarkable. Stomach/Bowel: The stomach is unremarkable. Multiple mildly dilated loops of gas and fluid-filled small bowel are seen involving the mid jejunum with a focal point of transition identified within the mid abdomen axial image # 47 and coronal image # 44 in keeping with a a distal partial or developing complete small bowel obstruction. There is fluid seen throughout the colon and rectum, a finding that may present clinically as diarrhea. Mild free intraperitoneal gas and extensive gas within the a body wall likely reflects the sequela of recent nephrectomy. No free intraperitoneal fluid. Vascular/Lymphatic: The abdominal vasculature is age-appropriate with mild aortoiliac atherosclerotic calcification. No aneurysm. There is no pathologic adenopathy within the abdomen and pelvis. Reproductive: Mild central prostatic enlargement indents the base of the bladder. Seminal vesicles are unremarkable. Other: Small fat containing left inguinal hernia noted. Musculoskeletal: No lytic or blastic bone lesions are seen. IMPRESSION: Distal partial or developing complete small bowel obstruction. Interval left nephrectomy. Extensive body wall subcutaneous gas and small free intraperitoneal gas is likely postsurgical in nature. Aortic Atherosclerosis (ICD10-I70.0). Electronically Signed   By: Fidela Salisbury MD   On: 06/25/2020  22:30   DG Abdomen Acute W/Chest  Result Date: 06/25/2020 CLINICAL DATA:  Abdominal pain, recent left nephrectomy, vomiting EXAM: ACUTE ABDOMEN SERIES (2 VIEW ABDOMEN AND 1 VIEW CHEST) COMPARISON:  06/23/2020 FINDINGS: Supine and upright frontal views of the abdomen as well as an upright frontal view of the chest are obtained. The cardiac silhouette is unremarkable. There is diffuse interstitial prominence throughout the lungs. Patchy consolidation is noted at the right lung base, which could be airspace disease or atelectasis. There is free gas below the right hemidiaphragm. There is progressive small bowel dilatation consistent with obstruction. Maximal diameter measures 4.2 cm. There are no abdominal masses or abnormal calcifications. Subcutaneous gas is seen within the left flank consistent with recent nephrectomy. No acute bony abnormalities. IMPRESSION: 1. Progressive small bowel dilatation consistent with small-bowel obstruction. 2. Free gas within the right upper abdomen as above, nonspecific given recent surgical intervention. This would have to be viewed with some suspicion given progressive small bowel distension. CT abdomen and pelvis with IV and oral contrast may be useful for further evaluation. 3. Patchy consolidation at the right lung base which may reflect atelectasis or airspace disease. Critical Value/emergent results were called by telephone at the time of interpretation on 06/25/2020 at 8:37 pm to provider JULIE HAVILAND , who verbally acknowledged these results. Electronically Signed   By: Randa Ngo M.D.   On: 06/25/2020 20:36     A/P: Bruce Douglas is an 59 y.o. male with an early post op SBO.  Cont NG for now.  May need supportive nutrition if he does not open up in the next 24-48hrs.   Elmo Putt  Marijo Conception, MD  [facility]

## 2020-06-26 NOTE — Progress Notes (Addendum)
CC: Abdominal pain nausea and vomiting  Subjective:  Pain and nausea are better.  He reports having discomfort on discharge it became progressively worse.  He has been having nausea and vomiting since going home.  NG is in almost to the hub.  So it has been advanced.  Sump was plugged but it seems to be working well.  He still distended with hyperactive bowel sounds.    He is also had a little bit of diarrhea here, he was also having some at home.  Complaining of thrush with beefy red tongue. Objective: Vital signs in last 24 hours: Temp:  [98.4 F (36.9 C)-99 F (37.2 C)] 98.4 F (36.9 C) (09/23 0749) Pulse Rate:  [70-94] 70 (09/23 0749) Resp:  [16-21] 17 (09/23 0749) BP: (115-132)/(79-93) 132/85 (09/23 0749) SpO2:  [91 %-98 %] 95 % (09/23 0749) Weight:  [67 kg] 67 kg (09/22 1944) Last BM Date: 06/25/20 300 NG recorded Urine x1 recorded  stool x1 recorded  Afebrile vital signs are stable, blood pressure mildly elevated. BMP shows glucose 106, BUN 31, creatinine 1.46, H/H 12.4/37.1 CT scan 9/22: Distal partial, or developing small bowel obstruction, interval left nephrectomy with extensive body wall subcutaneous gas and free intraperitoneal gas; likely postsurgical in nature. single view film 9/23 NG to the stomach although should be advanced several centimeters, dilated multiple small bowel loops consistent with SBO seen on CT yesterday. Intake/Output from previous day: No intake/output data recorded. Intake/Output this shift: Total I/O In: 300 [NG/GT:300] Out: -   General appearance: alert, cooperative and He has a rag over his forehead and just looks very uncomfortable Resp: clear to auscultation bilaterally GI: Mildly distended bowel sounds are hyperactive.  Some diarrhea  Lab Results:  Recent Labs    06/25/20 2029 06/26/20 0603  WBC 4.5  --   HGB 13.4 12.4*  HCT 37.2* 37.1*  PLT 271  --     BMET Recent Labs    06/25/20 2029 06/26/20 0603  NA 137 138   K 3.6 3.8  CL 105 104  CO2 21* 23  GLUCOSE 116* 106*  BUN 34* 31*  CREATININE 1.51* 1.46*  CALCIUM 8.7* 8.6*   PT/INR No results for input(s): LABPROT, INR in the last 72 hours.  Recent Labs  Lab 06/23/20 0445 06/25/20 2029  AST 13* 22  ALT 9 21  ALKPHOS 49 65  BILITOT 0.7 1.2  PROT 6.9 7.2  ALBUMIN 3.6 3.6     Lipase     Component Value Date/Time   LIPASE 67 (H) 06/25/2020 2029   Prior to Admission medications   Medication Sig Start Date End Date Taking? Authorizing Provider  acetaminophen (TYLENOL) 500 MG tablet Take 1,000 mg by mouth in the morning and at bedtime. Take with celecoxib   Yes [provider]  albuterol (VENTOLIN HFA) 108 (90 Base) MCG/ACT inhaler Inhale 2 puffs into the lungs every 4 (four) hours as needed. 06/03/20  Yes [provider]  amLODipine (NORVASC) 5 MG tablet Take 5 mg by mouth at bedtime.  05/20/20  Yes [provider]  atenolol (TENORMIN) 25 MG tablet Take 25 mg by mouth at bedtime.  04/21/20  Yes [provider]  buPROPion (WELLBUTRIN XL) 300 MG 24 hr tablet Take 300 mg by mouth at bedtime.  05/25/20  Yes [provider]  cyclobenzaprine (FLEXERIL) 10 MG tablet Take 10 mg by mouth See admin instructions. Take 1 tablet (10 mg) by mouth scheduled at bedtime, may take an  additional dose during the day if needed for back exertion. 04/21/20  Yes [provider]  docusate sodium (COLACE) 100 MG capsule Take 1 capsule (100 mg total) by mouth 2 (two) times daily. 06/22/20  Yes Alexis Frock, MD  doxazosin (CARDURA) 4 MG tablet Take 4 mg by mouth at bedtime. 04/21/20  Yes [provider]  HYDROcodone-acetaminophen (NORCO) 5-325 MG tablet Take 1-2 tablets by mouth every 6 (six) hours as needed for moderate pain. 06/22/20  Yes Alexis Frock, MD  levothyroxine (SYNTHROID) 137 MCG tablet Take 137 mcg by mouth at bedtime.  05/06/20  Yes [provider]  omeprazole (PRILOSEC) 20 MG capsule Take  40 mg by mouth at bedtime.  04/21/20  Yes [provider]  promethazine (PHENERGAN) 12.5 MG tablet Take 1 tablet (12.5 mg total) by mouth every 4 (four) hours as needed for nausea or vomiting. 06/20/20  Yes Dancy, Estill Bamberg, PA-C  QUEtiapine (SEROQUEL) 50 MG tablet Take 50 mg by mouth at bedtime.  05/03/20  Yes [provider]  varenicline (CHANTIX) 1 MG tablet Take 1 mg by mouth 2 (two) times daily.   Yes [provider]      Medications: . enoxaparin (LOVENOX) injection  40 mg Subcutaneous Q24H  . [START ON 06/29/2020] levothyroxine  68.5 mcg Intravenous Daily  . ondansetron (ZOFRAN) IV  4 mg Intravenous Once  . pantoprazole (PROTONIX) IV  40 mg Intravenous QHS   . 0.45 % NaCl with KCl 20 mEq / L 125 mL/hr at 06/26/20 0430  . acetaminophen 1,000 mg (06/26/20 0443)    Assessment/Plan CKD  -Creatinine 1.46 Hypertension Hypothyroid GERD Asthma Hx anxiety/depression Hx nephrolithiasis Creatinine - 1.46 - 06/26/2020  S/p Left robotic nephrectomy 06/20/2020 - Dr. Minette Brine - POD# 6  Pathology - 2.3 cm pT1a, clear-cell renal carcinoma involving left kidney, nuclear grade 2 SBO  FEN: IV fluids/n.p.o. ID: None DVT: Lovenox Follow-up: TBD pain  Plan: Continue NG decompression and bowel rest.  Recheck labs and film in the a.m. allow ice chips and Magic mouthwash with Xylocaine   LOS: 1 day   JENNINGS,WILLARD 06/26/2020 Please see Amion  Agree with above. Wife, Anderson Malta, in room. It sounds like he has had bowel trouble early after surgery and never really "opened up". Will need to address nutrition if his GI tract does not progress soon  Alphonsa Overall, MD, [facility] Surgery Office phone:  [phone]

## 2020-06-26 NOTE — Progress Notes (Signed)
Subjective: C/o mild generalized abdominal pain. No flatus since admission. Had some loose stools prior to admission. NGT with gastric output, tolerating well. No emesis around NGT. Denies fevers or chills. No SOB.  Objective: Vital signs in last 24 hours: Temp:  [98.6 F (37 C)-99 F (37.2 C)] 98.6 F (37 C) (09/23 0351) Pulse Rate:  [77-94] 78 (09/23 0351) Resp:  [16-21] 19 (09/23 0351) BP: (115-131)/(79-93) 117/90 (09/23 0351) SpO2:  [91 %-98 %] 94 % (09/23 0351) Weight:  [67 kg] 67 kg (09/22 1944)  Intake/Output from previous day: No intake/output data recorded. Intake/Output this shift: No intake/output data recorded.  UOP: not recorded. Pt states voiding without difficulty. Has yet to void this AM.  Physical Exam:  General: Alert and oriented CV: RRR Lungs: Clear Abdomen: Soft, mildly distended, non-tender, inc c/d/i, no peritoneal findings Ext: NT, No erythema  Lab Results: Recent Labs    06/25/20 2029  HGB 13.4  HCT 37.2*   BMET Recent Labs    06/25/20 2029  NA 137  K 3.6  CL 105  CO2 21*  GLUCOSE 116*  BUN 34*  CREATININE 1.51*  CALCIUM 8.7*     Studies/Results: DG Abd 1 View  Result Date: 06/26/2020 CLINICAL DATA:  Check gastric catheter placement EXAM: ABDOMEN - 1 VIEW COMPARISON:  None. FINDINGS: Gastric catheter is noted within the stomach. Proximal side port is noted at the gastroesophageal junction. This should be advanced several cm deeper into the stomach. Multiple dilated loops of small bowel are noted consistent with the prior CT findings. IMPRESSION: Gastric catheter within the stomach although it should be advanced several cm to allow for the proximal side port to gain access to the gastric lumen. Multiple dilated loops of small bowel consistent with small-bowel obstruction seen on prior CT. Electronically Signed   By: Inez Catalina M.D.   On: 06/26/2020 02:41   CT ABDOMEN PELVIS W CONTRAST  Result Date: 06/25/2020 CLINICAL DATA:   Unspecified abdominal pain, vomiting, diarrhea. Status post left nephrectomy. EXAM: CT ABDOMEN AND PELVIS WITH CONTRAST TECHNIQUE: Multidetector CT imaging of the abdomen and pelvis was performed using the standard protocol following bolus administration of intravenous contrast. CONTRAST:  49mL OMNIPAQUE IOHEXOL 300 MG/ML  SOLN COMPARISON:  10/26/2018 FINDINGS: Lower chest: There is discoid atelectasis involving the posterior basal lower lobes bilaterally. Trace bilateral pleural effusions, left greater than right. The visualized heart and pericardium are unremarkable. Hepatobiliary: 9 mm hypodensity is seen within the right hepatic lobe, axial image # 22/2, stable since prior examination and better characterized as a simple cyst on prior MRI of 04/21/2020. Liver and gallbladder are otherwise unremarkable. No intra or extrahepatic biliary ductal dilation peer Pancreas: Unremarkable Spleen: Unremarkable Adrenals/Urinary Tract: The adrenal glands are unremarkable. The right kidney is normal in size and position in contains a few stable simple cortical cysts. No hydronephrosis on the right. No intrarenal or ureteral calculi on the right. There has been interval left nephrectomy with a small amount of poorly circumscribed, bland appearing fluid within the resection bed along with a few punctate foci of gas. Gas is seen within the bladder lumen, possibly related to recent catheterization. The bladder is otherwise unremarkable. Stomach/Bowel: The stomach is unremarkable. Multiple mildly dilated loops of gas and fluid-filled small bowel are seen involving the mid jejunum with a focal point of transition identified within the mid abdomen axial image # 47 and coronal image # 44 in keeping with a a distal partial or developing complete small bowel obstruction.  There is fluid seen throughout the colon and rectum, a finding that may present clinically as diarrhea. Mild free intraperitoneal gas and extensive gas within the a  body wall likely reflects the sequela of recent nephrectomy. No free intraperitoneal fluid. Vascular/Lymphatic: The abdominal vasculature is age-appropriate with mild aortoiliac atherosclerotic calcification. No aneurysm. There is no pathologic adenopathy within the abdomen and pelvis. Reproductive: Mild central prostatic enlargement indents the base of the bladder. Seminal vesicles are unremarkable. Other: Small fat containing left inguinal hernia noted. Musculoskeletal: No lytic or blastic bone lesions are seen. IMPRESSION: Distal partial or developing complete small bowel obstruction. Interval left nephrectomy. Extensive body wall subcutaneous gas and small free intraperitoneal gas is likely postsurgical in nature. Aortic Atherosclerosis (ICD10-I70.0). Electronically Signed   By: Fidela Salisbury MD   On: 06/25/2020 22:30   DG Abdomen Acute W/Chest  Result Date: 06/25/2020 CLINICAL DATA:  Abdominal pain, recent left nephrectomy, vomiting EXAM: ACUTE ABDOMEN SERIES (2 VIEW ABDOMEN AND 1 VIEW CHEST) COMPARISON:  06/23/2020 FINDINGS: Supine and upright frontal views of the abdomen as well as an upright frontal view of the chest are obtained. The cardiac silhouette is unremarkable. There is diffuse interstitial prominence throughout the lungs. Patchy consolidation is noted at the right lung base, which could be airspace disease or atelectasis. There is free gas below the right hemidiaphragm. There is progressive small bowel dilatation consistent with obstruction. Maximal diameter measures 4.2 cm. There are no abdominal masses or abnormal calcifications. Subcutaneous gas is seen within the left flank consistent with recent nephrectomy. No acute bony abnormalities. IMPRESSION: 1. Progressive small bowel dilatation consistent with small-bowel obstruction. 2. Free gas within the right upper abdomen as above, nonspecific given recent surgical intervention. This would have to be viewed with some suspicion given  progressive small bowel distension. CT abdomen and pelvis with IV and oral contrast may be useful for further evaluation. 3. Patchy consolidation at the right lung base which may reflect atelectasis or airspace disease. Critical Value/emergent results were called by telephone at the time of interpretation on 06/25/2020 at 8:37 pm to provider JULIE HAVILAND , who verbally acknowledged these results. Electronically Signed   By: Randa Ngo M.D.   On: 06/25/2020 20:36    Assessment/Plan: 1. Small bowel obstruction:  S/p L RALNx on 06/20/20 with Dr. Lovena Neighbours for left renal mass with path resulting pT1a ccRCC G2 with ileus vs SBO. CT A/P 9/22 with distal partial or developing complete SBO. Mildly dilated loops of gas SB with transition point identified in mid abdomen. a. NPO/NGT b. Consider TPN POD7 c. May benefit from small bowel series this AM d. Will engage general surgery e. F/u AM labs   LOS: 1 day   Bruce Douglas 06/26/2020, 4:50 AM Matt R[facility]  Pager: [phone]

## 2020-06-26 NOTE — Progress Notes (Signed)
Pharmacy Note   Pharmacy was consulted to review pt PTA meds for appropriate change to IV forms.   Med Rec updated and completed on 9/23 at [facility].   - Will adjust levothyroxine 137 mcg to 68.5 mcg IV daily. However, as per P&T committee policy implement due to severe shortage of IV Synthroid, will start on 9/26 as med gets a 3 day hold  - Omeprazole 40 mg QHS will be adjusted to pantoprazole 40 mg IV QHS.   - PTA promethazine has already been addressed by admitting MD.   - PTA APAP has already been addressed by admitting MD.  - The following meds have no directly IV equivalent: amlodipine, atenolol, bupropion XL, cyclobenzaprine, doxazosin, hydrocodone/APAP, quetiapine, varenicline.    Royetta Asal, PharmD, BCPS 06/26/2020 2:09 AM

## 2020-06-27 ENCOUNTER — Inpatient Hospital Stay (HOSPITAL_COMMUNITY): Payer: 59

## 2020-06-27 LAB — BASIC METABOLIC PANEL
Anion gap: 11 (ref 5–15)
BUN: 26 mg/dL — ABNORMAL HIGH (ref 6–20)
CO2: 18 mmol/L — ABNORMAL LOW (ref 22–32)
Calcium: 8.2 mg/dL — ABNORMAL LOW (ref 8.9–10.3)
Chloride: 106 mmol/L (ref 98–111)
Creatinine, Ser: 1.45 mg/dL — ABNORMAL HIGH (ref 0.61–1.24)
GFR calc Af Amer: >60 mL/min (ref >60)
GFR calc non Af Amer: 52 mL/min — ABNORMAL LOW (ref >60)
Glucose, Bld: 68 mg/dL — ABNORMAL LOW (ref 70–99)
Potassium: 4.2 mmol/L (ref 3.5–5.1)
Sodium: 135 mmol/L (ref 135–145)

## 2020-06-27 MED ORDER — DOXAZOSIN MESYLATE 4 MG PO TABS
4.0000 mg | ORAL_TABLET | Freq: Every day | ORAL | Status: DC
  Administered 2020-06-27 – 2020-07-02 (×6): 4 mg via ORAL
  Filled 2020-06-27 (×6): qty 1

## 2020-06-27 MED ORDER — ALBUTEROL SULFATE HFA 108 (90 BASE) MCG/ACT IN AERS: 2.0000 | INHALATION_SPRAY | RESPIRATORY_TRACT | Status: DC | PRN

## 2020-06-27 MED ORDER — LEVOTHYROXINE SODIUM 25 MCG PO TABS
137.0000 ug | ORAL_TABLET | Freq: Every day | ORAL | Status: DC
  Administered 2020-06-27 – 2020-07-02 (×6): 137 ug via ORAL
  Filled 2020-06-27 (×6): qty 1

## 2020-06-27 MED ORDER — QUETIAPINE FUMARATE 50 MG PO TABS
50.0000 mg | ORAL_TABLET | Freq: Every day | ORAL | Status: DC
  Administered 2020-06-27 – 2020-07-02 (×6): 50 mg via ORAL
  Filled 2020-06-27 (×6): qty 1

## 2020-06-27 MED ORDER — ATENOLOL 25 MG PO TABS
25.0000 mg | ORAL_TABLET | Freq: Every day | ORAL | Status: DC
  Administered 2020-06-27 – 2020-07-02 (×6): 25 mg via ORAL
  Filled 2020-06-27 (×6): qty 1

## 2020-06-27 MED ORDER — BUPROPION HCL ER (XL) 300 MG PO TB24
300.0000 mg | ORAL_TABLET | Freq: Every day | ORAL | Status: DC
  Administered 2020-06-27 – 2020-07-02 (×6): 300 mg via ORAL
  Filled 2020-06-27 (×6): qty 1

## 2020-06-27 MED ORDER — AMLODIPINE BESYLATE 5 MG PO TABS
5.0000 mg | ORAL_TABLET | Freq: Every day | ORAL | Status: DC
  Administered 2020-06-27 – 2020-07-02 (×6): 5 mg via ORAL
  Filled 2020-06-27 (×6): qty 1

## 2020-06-27 MED ORDER — LORAZEPAM 2 MG/ML IJ SOLN
0.5000 mg | Freq: Four times a day (QID) | INTRAMUSCULAR | Status: DC | PRN
  Administered 2020-06-27: 0.5 mg via INTRAVENOUS
  Filled 2020-06-27: qty 1

## 2020-06-27 MED ORDER — POLYVINYL ALCOHOL 1.4 % OP SOLN
1.0000 [drp] | OPHTHALMIC | Status: DC | PRN
  Filled 2020-06-27: qty 15

## 2020-06-27 NOTE — Progress Notes (Addendum)
Subjective:  Patient feeling better today.  Passing flatus now.  No BM  ROS: See above, otherwise other systems negative  Objective: Vital signs in last 24 hours: Temp:  [98 F (36.7 C)-98.4 F (36.9 C)] 98.1 F (36.7 C) (09/24 0543) Pulse Rate:  [63-81] 63 (09/24 0543) Resp:  [12-18] 18 (09/24 0543) BP: (122-137)/(79-89) 124/82 (09/24 0543) SpO2:  [93 %-97 %] 97 % (09/24 0543) Last BM Date: 06/25/20  Intake/Output from previous day: 09/23 0701 - 09/24 0700 In: 1910.3 [P.O.:60; I.V.:1350.3; NG/GT:300; IV Piggyback:200] Out: 1225 [Urine:325; Emesis/NG output:900] Intake/Output this shift: No intake/output data recorded.  PE: Abd: soft, appropriately tender, NGT with bilious/light brown output, some BS present, ND  Lab Results:  Recent Labs    06/25/20 2029 06/26/20 0603  WBC 4.5  --   HGB 13.4 12.4*  HCT 37.2* 37.1*  PLT 271  --    BMET Recent Labs    06/26/20 0603 06/27/20 0532  NA 138 135  K 3.8 4.2  CL 104 106  CO2 23 18*  GLUCOSE 106* 68*  BUN 31* 26*  CREATININE 1.46* 1.45*  CALCIUM 8.6* 8.2*   PT/INR No results for input(s): LABPROT, INR in the last 72 hours. CMP     Component Value Date/Time   NA 135 06/27/2020 0532   K 4.2 06/27/2020 0532   CL 106 06/27/2020 0532   CO2 18 (L) 06/27/2020 0532   GLUCOSE 68 (L) 06/27/2020 0532   BUN 26 (H) 06/27/2020 0532   CREATININE 1.45 (H) 06/27/2020 0532   CALCIUM 8.2 (L) 06/27/2020 0532   PROT 7.2 06/25/2020 2029   ALBUMIN 3.6 06/25/2020 2029   AST 22 06/25/2020 2029   ALT 21 06/25/2020 2029   ALKPHOS 65 06/25/2020 2029   BILITOT 1.2 06/25/2020 2029   GFRNONAA 52 (L) 06/27/2020 0532   GFRAA >60 06/27/2020 0532   Lipase     Component Value Date/Time   LIPASE 67 (H) 06/25/2020 2029       Studies/Results: DG Abd 1 View  Result Date: 06/27/2020 CLINICAL DATA:  Small-bowel obstruction. EXAM: ABDOMEN - 1 VIEW COMPARISON:  06/26/2020.  CT 06/25/2020. FINDINGS: NG tube noted with its tip  over the proximal duodenum. Persistent prominent small bowel distention consistent with small bowel obstruction. Small bowel measures up to 4.4 cm in diameter. Colonic gas pattern is unremarkable. Intraluminal air noted throughout the colon. No free air. Mild lumbar spine scoliosis concave left. Degenerative changes lumbar spine. Small radiopacity noted the proximal right femur most likely bone island. IMPRESSION: 1.  NG tube noted with tip in the proximal duodenum. 2. Persistent prominent small bowel distention consistent with persistent small-bowel obstruction. Intraluminal air is noted throughout the colon. No free air is identified. Electronically Signed   By: Marcello Moores  Register   On: 06/27/2020 07:49   DG Abd 1 View  Result Date: 06/26/2020 CLINICAL DATA:  Check gastric catheter placement EXAM: ABDOMEN - 1 VIEW COMPARISON:  None. FINDINGS: Gastric catheter is noted within the stomach. Proximal side port is noted at the gastroesophageal junction. This should be advanced several cm deeper into the stomach. Multiple dilated loops of small bowel are noted consistent with the prior CT findings. IMPRESSION: Gastric catheter within the stomach although it should be advanced several cm to allow for the proximal side port to gain access to the gastric lumen. Multiple dilated loops of small bowel consistent with small-bowel obstruction seen on prior CT. Electronically Signed   By: Elta Guadeloupe  Lukens M.D.   On: 06/26/2020 02:41   CT ABDOMEN PELVIS W CONTRAST  Result Date: 06/25/2020 CLINICAL DATA:  Unspecified abdominal pain, vomiting, diarrhea. Status post left nephrectomy. EXAM: CT ABDOMEN AND PELVIS WITH CONTRAST TECHNIQUE: Multidetector CT imaging of the abdomen and pelvis was performed using the standard protocol following bolus administration of intravenous contrast. CONTRAST:  54mL OMNIPAQUE IOHEXOL 300 MG/ML  SOLN COMPARISON:  10/26/2018 FINDINGS: Lower chest: There is discoid atelectasis involving the posterior  basal lower lobes bilaterally. Trace bilateral pleural effusions, left greater than right. The visualized heart and pericardium are unremarkable. Hepatobiliary: 9 mm hypodensity is seen within the right hepatic lobe, axial image # 22/2, stable since prior examination and better characterized as a simple cyst on prior MRI of 04/21/2020. Liver and gallbladder are otherwise unremarkable. No intra or extrahepatic biliary ductal dilation peer Pancreas: Unremarkable Spleen: Unremarkable Adrenals/Urinary Tract: The adrenal glands are unremarkable. The right kidney is normal in size and position in contains a few stable simple cortical cysts. No hydronephrosis on the right. No intrarenal or ureteral calculi on the right. There has been interval left nephrectomy with a small amount of poorly circumscribed, bland appearing fluid within the resection bed along with a few punctate foci of gas. Gas is seen within the bladder lumen, possibly related to recent catheterization. The bladder is otherwise unremarkable. Stomach/Bowel: The stomach is unremarkable. Multiple mildly dilated loops of gas and fluid-filled small bowel are seen involving the mid jejunum with a focal point of transition identified within the mid abdomen axial image # 47 and coronal image # 44 in keeping with a a distal partial or developing complete small bowel obstruction. There is fluid seen throughout the colon and rectum, a finding that may present clinically as diarrhea. Mild free intraperitoneal gas and extensive gas within the a body wall likely reflects the sequela of recent nephrectomy. No free intraperitoneal fluid. Vascular/Lymphatic: The abdominal vasculature is age-appropriate with mild aortoiliac atherosclerotic calcification. No aneurysm. There is no pathologic adenopathy within the abdomen and pelvis. Reproductive: Mild central prostatic enlargement indents the base of the bladder. Seminal vesicles are unremarkable. Other: Small fat containing  left inguinal hernia noted. Musculoskeletal: No lytic or blastic bone lesions are seen. IMPRESSION: Distal partial or developing complete small bowel obstruction. Interval left nephrectomy. Extensive body wall subcutaneous gas and small free intraperitoneal gas is likely postsurgical in nature. Aortic Atherosclerosis (ICD10-I70.0). Electronically Signed   By: Fidela Salisbury MD   On: 06/25/2020 22:30   DG Abdomen Acute W/Chest  Result Date: 06/25/2020 CLINICAL DATA:  Abdominal pain, recent left nephrectomy, vomiting EXAM: ACUTE ABDOMEN SERIES (2 VIEW ABDOMEN AND 1 VIEW CHEST) COMPARISON:  06/23/2020 FINDINGS: Supine and upright frontal views of the abdomen as well as an upright frontal view of the chest are obtained. The cardiac silhouette is unremarkable. There is diffuse interstitial prominence throughout the lungs. Patchy consolidation is noted at the right lung base, which could be airspace disease or atelectasis. There is free gas below the right hemidiaphragm. There is progressive small bowel dilatation consistent with obstruction. Maximal diameter measures 4.2 cm. There are no abdominal masses or abnormal calcifications. Subcutaneous gas is seen within the left flank consistent with recent nephrectomy. No acute bony abnormalities. IMPRESSION: 1. Progressive small bowel dilatation consistent with small-bowel obstruction. 2. Free gas within the right upper abdomen as above, nonspecific given recent surgical intervention. This would have to be viewed with some suspicion given progressive small bowel distension. CT abdomen and pelvis with IV and  oral contrast may be useful for further evaluation. 3. Patchy consolidation at the right lung base which may reflect atelectasis or airspace disease. Critical Value/emergent results were called by telephone at the time of interpretation on 06/25/2020 at 8:37 pm to provider JULIE HAVILAND , who verbally acknowledged these results. Electronically Signed   By: Randa Ngo M.D.   On: 06/25/2020 20:36    Anti-infectives: Anti-infectives (From admission, onward)   None       Assessment/Plan CKD  -Creatinine 1.45 Hypertension Hypothyroid GERD Asthma Hx anxiety/depression Hx nephrolithiasis  SBO vs ileus, S/p Left robotic nephrectomy 06/20/2020 - Dr. Minette Brine - POD# 7 -film looks better today with air throughout his colon.  He is passing flatus -will clamp NGT And allow ice and few sips and see how he tolerates this today.  If he tolerates well, will likely DC NGT tomorrow -mobilize/pulm toilet       FEN: IV fluids/n.p.o./NGT clamped ID: None DVT: Lovenox    LOS: 2 days    Henreitta Cea , [facility] 06/27/2020, 11:41 AM Please see Amion for pager number during day hours 7:00am-4:30pm or 7:00am -11:30am on weekends  Agree with above. Wife, Anderson Malta, in room. He looks better than last night.  KUB somewhat better. To try clamping NGT.  Alphonsa Overall, MD, [facility] Surgery Office phone:  [phone]

## 2020-06-27 NOTE — Progress Notes (Signed)
Subjective: The patient is feeling much better after NG tube placement yesterday.  He states that he has passed flatus multiple times over the past 24 hours, but denies any bowel movements.  States that his appetite is improving.  KUB shows improvement in his small bowel dilation  Objective: Vital signs in last 24 hours: Temp:  [98 F (36.7 C)-98.2 F (36.8 C)] 98.2 F (36.8 C) (09/24 1302) Pulse Rate:  [63-78] 78 (09/24 1302) Resp:  [12-18] 17 (09/24 1302) BP: (124-141)/(82-89) 141/86 (09/24 1302) SpO2:  [93 %-98 %] 98 % (09/24 1302)  Intake/Output from previous day: 09/23 0701 - 09/24 0700 In: 1910.3 [P.O.:60; I.V.:1350.3; NG/GT:300; IV Piggyback:200] Out: 1225 [Urine:325; Emesis/NG output:900]  Intake/Output this shift: No intake/output data recorded.  Physical Exam:  General: Alert and oriented CV: RRR, palpable distal pulses Lungs: CTAB, equal chest rise Abdomen: Soft, NTND, no rebound or guarding. NG tube in place with bilious output Incisions: Clean, dry and intact Ext: NT, No erythema  Lab Results: Recent Labs    06/25/20 2029 06/26/20 0603  HGB 13.4 12.4*  HCT 37.2* 37.1*   BMET Recent Labs    06/26/20 0603 06/27/20 0532  NA 138 135  K 3.8 4.2  CL 104 106  CO2 23 18*  GLUCOSE 106* 68*  BUN 31* 26*  CREATININE 1.46* 1.45*  CALCIUM 8.6* 8.2*     Studies/Results: DG Abd 1 View  Result Date: 06/27/2020 CLINICAL DATA:  Small-bowel obstruction. EXAM: ABDOMEN - 1 VIEW COMPARISON:  06/26/2020.  CT 06/25/2020. FINDINGS: NG tube noted with its tip over the proximal duodenum. Persistent prominent small bowel distention consistent with small bowel obstruction. Small bowel measures up to 4.4 cm in diameter. Colonic gas pattern is unremarkable. Intraluminal air noted throughout the colon. No free air. Mild lumbar spine scoliosis concave left. Degenerative changes lumbar spine. Small radiopacity noted the proximal right femur most likely bone island. IMPRESSION:  1.  NG tube noted with tip in the proximal duodenum. 2. Persistent prominent small bowel distention consistent with persistent small-bowel obstruction. Intraluminal air is noted throughout the colon. No free air is identified. Electronically Signed   By: Marcello Moores  Register   On: 06/27/2020 07:49   DG Abd 1 View  Result Date: 06/26/2020 CLINICAL DATA:  Check gastric catheter placement EXAM: ABDOMEN - 1 VIEW COMPARISON:  None. FINDINGS: Gastric catheter is noted within the stomach. Proximal side port is noted at the gastroesophageal junction. This should be advanced several cm deeper into the stomach. Multiple dilated loops of small bowel are noted consistent with the prior CT findings. IMPRESSION: Gastric catheter within the stomach although it should be advanced several cm to allow for the proximal side port to gain access to the gastric lumen. Multiple dilated loops of small bowel consistent with small-bowel obstruction seen on prior CT. Electronically Signed   By: Inez Catalina M.D.   On: 06/26/2020 02:41   CT ABDOMEN PELVIS W CONTRAST  Result Date: 06/25/2020 CLINICAL DATA:  Unspecified abdominal pain, vomiting, diarrhea. Status post left nephrectomy. EXAM: CT ABDOMEN AND PELVIS WITH CONTRAST TECHNIQUE: Multidetector CT imaging of the abdomen and pelvis was performed using the standard protocol following bolus administration of intravenous contrast. CONTRAST:  18mL OMNIPAQUE IOHEXOL 300 MG/ML  SOLN COMPARISON:  10/26/2018 FINDINGS: Lower chest: There is discoid atelectasis involving the posterior basal lower lobes bilaterally. Trace bilateral pleural effusions, left greater than right. The visualized heart and pericardium are unremarkable. Hepatobiliary: 9 mm hypodensity is seen within the right hepatic lobe,  axial image # 22/2, stable since prior examination and better characterized as a simple cyst on prior MRI of 04/21/2020. Liver and gallbladder are otherwise unremarkable. No intra or extrahepatic  biliary ductal dilation peer Pancreas: Unremarkable Spleen: Unremarkable Adrenals/Urinary Tract: The adrenal glands are unremarkable. The right kidney is normal in size and position in contains a few stable simple cortical cysts. No hydronephrosis on the right. No intrarenal or ureteral calculi on the right. There has been interval left nephrectomy with a small amount of poorly circumscribed, bland appearing fluid within the resection bed along with a few punctate foci of gas. Gas is seen within the bladder lumen, possibly related to recent catheterization. The bladder is otherwise unremarkable. Stomach/Bowel: The stomach is unremarkable. Multiple mildly dilated loops of gas and fluid-filled small bowel are seen involving the mid jejunum with a focal point of transition identified within the mid abdomen axial image # 47 and coronal image # 44 in keeping with a a distal partial or developing complete small bowel obstruction. There is fluid seen throughout the colon and rectum, a finding that may present clinically as diarrhea. Mild free intraperitoneal gas and extensive gas within the a body wall likely reflects the sequela of recent nephrectomy. No free intraperitoneal fluid. Vascular/Lymphatic: The abdominal vasculature is age-appropriate with mild aortoiliac atherosclerotic calcification. No aneurysm. There is no pathologic adenopathy within the abdomen and pelvis. Reproductive: Mild central prostatic enlargement indents the base of the bladder. Seminal vesicles are unremarkable. Other: Small fat containing left inguinal hernia noted. Musculoskeletal: No lytic or blastic bone lesions are seen. IMPRESSION: Distal partial or developing complete small bowel obstruction. Interval left nephrectomy. Extensive body wall subcutaneous gas and small free intraperitoneal gas is likely postsurgical in nature. Aortic Atherosclerosis (ICD10-I70.0). Electronically Signed   By: Fidela Salisbury MD   On: 06/25/2020 22:30   DG  Abdomen Acute W/Chest  Result Date: 06/25/2020 CLINICAL DATA:  Abdominal pain, recent left nephrectomy, vomiting EXAM: ACUTE ABDOMEN SERIES (2 VIEW ABDOMEN AND 1 VIEW CHEST) COMPARISON:  06/23/2020 FINDINGS: Supine and upright frontal views of the abdomen as well as an upright frontal view of the chest are obtained. The cardiac silhouette is unremarkable. There is diffuse interstitial prominence throughout the lungs. Patchy consolidation is noted at the right lung base, which could be airspace disease or atelectasis. There is free gas below the right hemidiaphragm. There is progressive small bowel dilatation consistent with obstruction. Maximal diameter measures 4.2 cm. There are no abdominal masses or abnormal calcifications. Subcutaneous gas is seen within the left flank consistent with recent nephrectomy. No acute bony abnormalities. IMPRESSION: 1. Progressive small bowel dilatation consistent with small-bowel obstruction. 2. Free gas within the right upper abdomen as above, nonspecific given recent surgical intervention. This would have to be viewed with some suspicion given progressive small bowel distension. CT abdomen and pelvis with IV and oral contrast may be useful for further evaluation. 3. Patchy consolidation at the right lung base which may reflect atelectasis or airspace disease. Critical Value/emergent results were called by telephone at the time of interpretation on 06/25/2020 at 8:37 pm to provider JULIE HAVILAND , who verbally acknowledged these results. Electronically Signed   By: Randa Ngo M.D.   On: 06/25/2020 20:36    Assessment/Plan: 59 year old male postop day 6 status post robotic left radical nephrectomy now with postoperative ileus/small bowel obstruction  -General surgery is planning a clamp trial of his NG tube later today. -Continue IV fluids and ice chips -I consulted the pharmacy for  suggestions on replacements for his Seroquel and Wellbutrin and recommended Ativan as  needed anxiety. -Continue Lovenox for DVT prophylaxis -He is making slow, but steady progress.  We will continue to monitor.  Appreciate general surgery's input and assistance.   LOS: 2 days   Ellison Hughs, MD [facility] Pager: [phone]  06/27/2020, 2:08 PM

## 2020-06-27 NOTE — TOC Progression Note (Signed)
Transition of Care [facility]) - Progression Note    Patient Details  Name: Bruce Douglas MRN: [id number] Date of Birth: December 17, 1960  Transition of Care [facility]) CM/SW Contact  Purcell Mouton, RN Phone Number: 06/27/2020, 2:11 PM  Clinical Narrative:    Pt from home with his wife. Plans are to return home. TOC will follow for discharge needs.   Expected Discharge Plan: Home/Self Care Barriers to Discharge: No Barriers Identified  Expected Discharge Plan and Services Expected Discharge Plan: Home/Self Care       Living arrangements for the past 2 months: Single Family Home                                       Social Determinants of Health (SDOH) Interventions    Readmission Risk Interventions No flowsheet data found.

## 2020-06-28 LAB — BASIC METABOLIC PANEL
Anion gap: 11 (ref 5–15)
BUN: 21 mg/dL — ABNORMAL HIGH (ref 6–20)
CO2: 17 mmol/L — ABNORMAL LOW (ref 22–32)
Calcium: 8.6 mg/dL — ABNORMAL LOW (ref 8.9–10.3)
Chloride: 104 mmol/L (ref 98–111)
Creatinine, Ser: 1.27 mg/dL — ABNORMAL HIGH (ref 0.61–1.24)
GFR calc Af Amer: >60 mL/min (ref >60)
GFR calc non Af Amer: >60 mL/min (ref >60)
Glucose, Bld: 79 mg/dL (ref 70–99)
Potassium: 4 mmol/L (ref 3.5–5.1)
Sodium: 132 mmol/L — ABNORMAL LOW (ref 135–145)

## 2020-06-28 NOTE — Progress Notes (Signed)
Assessment & Plan:  SBO vs ileus, S/pLeft robotic nephrectomy 06/20/2020-POD#8  Tolerated NG clamped overnight, small flatus, no BM  Begin clear liquid diet today - leave NG in place at patient request  Encouraged OOB, ambulation  Will follow.       Armandina Gemma, MD       [facility]       Office: [phone]   Chief Complaint: Status post nephrectomy, SBO vs ileus  Subjective: Patient in bed, comfortable.  No nausea or emesis overnight.  Small flatus, no BM.  Objective: Vital signs in last 24 hours: Temp:  [97.9 F (36.6 C)-98.3 F (36.8 C)] 97.9 F (36.6 C) (09/25 0433) Pulse Rate:  [65-78] 65 (09/25 0433) Resp:  [16-18] 18 (09/25 0433) BP: (132-152)/(86-91) 132/88 (09/25 0433) SpO2:  [94 %-98 %] 94 % (09/25 0433) Last BM Date: 06/25/20  Intake/Output from previous day: No intake/output data recorded. Intake/Output this shift: No intake/output data recorded.  Physical Exam: HEENT - sclerae clear, mucous membranes moist Neck - soft Chest - clear bilaterally Cor - RRR Abdomen - soft, mild distension; non-tender; BS present Ext - no edema, non-tender Neuro - alert & oriented, no focal deficits  Lab Results:  Recent Labs    06/25/20 2029 06/26/20 0603  WBC 4.5  --   HGB 13.4 12.4*  HCT 37.2* 37.1*  PLT 271  --    BMET Recent Labs    06/27/20 0532 06/28/20 0445  NA 135 132*  K 4.2 4.0  CL 106 104  CO2 18* 17*  GLUCOSE 68* 79  BUN 26* 21*  CREATININE 1.45* 1.27*  CALCIUM 8.2* 8.6*   PT/INR No results for input(s): LABPROT, INR in the last 72 hours. Comprehensive Metabolic Panel:    Component Value Date/Time   NA 132 (L) 06/28/2020 0445   NA 135 06/27/2020 0532   K 4.0 06/28/2020 0445   K 4.2 06/27/2020 0532   CL 104 06/28/2020 0445   CL 106 06/27/2020 0532   CO2 17 (L) 06/28/2020 0445   CO2 18 (L) 06/27/2020 0532   BUN 21 (H) 06/28/2020 0445   BUN 26 (H) 06/27/2020 0532   CREATININE 1.27 (H) 06/28/2020 0445     CREATININE 1.45 (H) 06/27/2020 0532   GLUCOSE 79 06/28/2020 0445   GLUCOSE 68 (L) 06/27/2020 0532   CALCIUM 8.6 (L) 06/28/2020 0445   CALCIUM 8.2 (L) 06/27/2020 0532   AST 22 06/25/2020 2029   AST 13 (L) 06/23/2020 0445   ALT 21 06/25/2020 2029   ALT 9 06/23/2020 0445   ALKPHOS 65 06/25/2020 2029   ALKPHOS 49 06/23/2020 0445   BILITOT 1.2 06/25/2020 2029   BILITOT 0.7 06/23/2020 0445   PROT 7.2 06/25/2020 2029   PROT 6.9 06/23/2020 0445   ALBUMIN 3.6 06/25/2020 2029   ALBUMIN 3.6 06/23/2020 0445    Studies/Results: DG Abd 1 View  Result Date: 06/27/2020 CLINICAL DATA:  Small-bowel obstruction. EXAM: ABDOMEN - 1 VIEW COMPARISON:  06/26/2020.  CT 06/25/2020. FINDINGS: NG tube noted with its tip over the proximal duodenum. Persistent prominent small bowel distention consistent with small bowel obstruction. Small bowel measures up to 4.4 cm in diameter. Colonic gas pattern is unremarkable. Intraluminal air noted throughout the colon. No free air. Mild lumbar spine scoliosis concave left. Degenerative changes lumbar spine. Small radiopacity noted the proximal right femur most likely bone island. IMPRESSION: 1.  NG tube noted with tip in the proximal duodenum. 2. Persistent prominent small bowel  distention consistent with persistent small-bowel obstruction. Intraluminal air is noted throughout the colon. No free air is identified. Electronically Signed   By: Marcello Moores  Register   On: 06/27/2020 07:49      Armandina Gemma 06/28/2020  Patient ID: Bruce Douglas, male   DOB: [birth year], 59 y.o.   MRN: [id number]

## 2020-06-28 NOTE — Plan of Care (Signed)
  Problem: Bowel/Gastric: Goal: Will show no signs and symptoms of gastrointestinal bleeding Outcome: Progressing   Problem: Clinical Measurements: Goal: Complications related to the disease process, condition or treatment will be avoided or minimized Outcome: Progressing   Problem: Clinical Measurements: Goal: Ability to maintain clinical measurements within normal limits will improve Outcome: Progressing Goal: Will remain free from infection Outcome: Progressing Goal: Diagnostic test results will improve Outcome: Progressing Goal: Respiratory complications will improve Outcome: Progressing Goal: Cardiovascular complication will be avoided Outcome: Progressing   Problem: Activity: Goal: Risk for activity intolerance will decrease Outcome: Progressing   Problem: Nutrition: Goal: Adequate nutrition will be maintained Outcome: Progressing   Problem: Coping: Goal: Level of anxiety will decrease Outcome: Progressing   Problem: Elimination: Goal: Will not experience complications related to bowel motility Outcome: Progressing   Problem: Pain Managment: Goal: General experience of comfort will improve Outcome: Progressing   Problem: Safety: Goal: Ability to remain free from injury will improve Outcome: Progressing   Problem: Skin Integrity: Goal: Risk for impaired skin integrity will decrease Outcome: Progressing

## 2020-06-28 NOTE — Progress Notes (Signed)
Urology Inpatient Progress Report     Intv/Subj: No acute events overnight. Patient is without complaint. Had a very small BM today.  Small amount of flatus.  He is ambulating well.  Wants to keep his NG tube in.  Active Problems:   Small bowel obstruction (HCC)  Current Facility-Administered Medications  Medication Dose Route Frequency Provider Last Rate Last Admin  . 0.45 % NaCl with KCl 20 mEq / L infusion   Intravenous Continuous Ceasar Mons, MD 100 mL/hr at 06/28/20 0950 New Bag at 06/28/20 0950  . albuterol (VENTOLIN HFA) 108 (90 Base) MCG/ACT inhaler 2 puff  2 puff Inhalation Q4H PRN Celene Squibb, MD      . amLODipine (NORVASC) tablet 5 mg  5 mg Oral QHS Celene Squibb, MD   5 mg at 06/27/20 2004  . atenolol (TENORMIN) tablet 25 mg  25 mg Oral QHS Celene Squibb, MD   25 mg at 06/27/20 2004  . buPROPion (WELLBUTRIN XL) 24 hr tablet 300 mg  300 mg Oral QHS Celene Squibb, MD   300 mg at 06/27/20 2003  . doxazosin (CARDURA) tablet 4 mg  4 mg Oral QHS Celene Squibb, MD   4 mg at 06/27/20 2004  . enoxaparin (LOVENOX) injection 40 mg  40 mg Subcutaneous Q24H Celene Squibb, MD   40 mg at 06/28/20 0929  . levothyroxine (SYNTHROID) tablet 137 mcg  137 mcg Oral QHS Ceasar Mons, MD   137 mcg at 06/27/20 2002  . LORazepam (ATIVAN) injection 0.5 mg  0.5 mg Intravenous Q6H PRN Ceasar Mons, MD   0.5 mg at 06/27/20 2007  . magic mouthwash w/lidocaine  5 mL Oral QID Earnstine Regal, PA-C   5 mL at 06/28/20 8338  . morphine 2 MG/ML injection 1-2 mg  1-2 mg Intravenous Q2H PRN Celene Squibb, MD   2 mg at 06/28/20 0951  . nystatin (MYCOSTATIN) 100000 UNIT/ML suspension 500,000 Units  5 mL Oral QID Janith Lima, MD   500,000 Units at 06/28/20 0930  . ondansetron (ZOFRAN) injection 4 mg  4 mg Intravenous Once Celene Squibb, MD      . ondansetron [facility]) injection 4 mg  4 mg Intravenous Q6H PRN Celene Squibb, MD   4 mg at  06/27/20 2008  . pantoprazole (PROTONIX) injection 40 mg  40 mg Intravenous QHS Janith Lima, MD   40 mg at 06/27/20 2007  . phenol (CHLORASEPTIC) mouth spray 1 spray  1 spray Mouth/Throat PRN Celene Squibb, MD   1 spray at 06/26/20 1224  . polyvinyl alcohol (LIQUIFILM TEARS) 1.4 % ophthalmic solution 1 drop  1 drop Both Eyes PRN Janith Lima, MD      . promethazine (PHENERGAN) injection 6.25 mg  6.25 mg Intravenous Q4H PRN Celene Squibb, MD      . QUEtiapine (SEROQUEL) tablet 50 mg  50 mg Oral QHS Celene Squibb, MD   50 mg at 06/27/20 2004     Objective: Vital: Vitals:   06/27/20 0543 06/27/20 1302 06/27/20 2000 06/28/20 0433  BP: 124/82 (!) 141/86 (!) 152/91 132/88  Pulse: 63 78 73 65  Resp: 18 17 16 18   Temp: 98.1 F (36.7 C) 98.2 F (36.8 C) 98.3 F (36.8 C) 97.9 F (36.6 C)  TempSrc:  Oral Oral Oral  SpO2: 97% 98% 97% 94%  Weight:      Height:       I/Os: I/O last 3 completed shifts: In: -  Out: 700 [Emesis/NG output:700]  Physical Exam:  General: Patient is in no apparent distress Lungs: Normal respiratory effort, chest expands symmetrically. GI: Incisions are c/d/i.  The abdomen is soft and nontender without mass. Ext: lower extremities symmetric  Lab Results: Recent Labs    06/25/20 2029 06/26/20 0603  WBC 4.5  --   HGB 13.4 12.4*  HCT 37.2* 37.1*   Recent Labs    06/26/20 0603 06/27/20 0532 06/28/20 0445  NA 138 135 132*  K 3.8 4.2 4.0  CL 104 106 104  CO2 23 18* 17*  GLUCOSE 106* 68* 79  BUN 31* 26* 21*  CREATININE 1.46* 1.45* 1.27*  CALCIUM 8.6* 8.2* 8.6*   No results for input(s): LABPT, INR in the last 72 hours. No results for input(s): LABURIN in the last 72 hours. Results for orders placed or performed during the hospital encounter of 06/25/20  SARS Coronavirus 2 by RT PCR (hospital order, performed in [facility] hospital lab) Nasopharyngeal Nasopharyngeal Swab     Status: None   Collection Time: 06/25/20 11:08 PM    Specimen: Nasopharyngeal Swab  Result Value Ref Range Status   SARS Coronavirus 2 NEGATIVE NEGATIVE Final    Comment: (NOTE) SARS-CoV-2 target nucleic acids are NOT DETECTED.  The SARS-CoV-2 RNA is generally detectable in upper and lower respiratory specimens during the acute phase of infection. The lowest concentration of SARS-CoV-2 viral copies this assay can detect is 250 copies / mL. A negative result does not preclude SARS-CoV-2 infection and should not be used as the sole basis for treatment or other patient management decisions.  A negative result may occur with improper specimen collection / handling, submission of specimen other than nasopharyngeal swab, presence of viral mutation(s) within the areas targeted by this assay, and inadequate number of viral copies (<250 copies / mL). A negative result must be combined with clinical observations, patient history, and epidemiological information.  Fact Sheet for Patients:   StrictlyIdeas.no  Fact Sheet for Healthcare Providers: BankingDealers.co.za  This test is not yet approved or  cleared by the [place] FDA and has been authorized for detection and/or diagnosis of SARS-CoV-2 by FDA under an Emergency Use Authorization (EUA).  This EUA will remain in effect (meaning this test can be used) for the duration of the COVID-19 declaration under Section 564(b)(1) of the Act, 21 U.S.C. section 360bbb-3(b)(1), unless the authorization is terminated or revoked sooner.  Performed at [facility], [place] [address] [place], [place] [address]     Studies/Results: DG Abd 1 View  Result Date: 06/27/2020 CLINICAL DATA:  Small-bowel obstruction. EXAM: ABDOMEN - 1 VIEW COMPARISON:  06/26/2020.  CT 06/25/2020. FINDINGS: NG tube noted with its tip over the proximal duodenum. Persistent prominent small bowel distention consistent with small bowel obstruction. Small bowel  measures up to 4.4 cm in diameter. Colonic gas pattern is unremarkable. Intraluminal air noted throughout the colon. No free air. Mild lumbar spine scoliosis concave left. Degenerative changes lumbar spine. Small radiopacity noted the proximal right femur most likely bone island. IMPRESSION: 1.  NG tube noted with tip in the proximal duodenum. 2. Persistent prominent small bowel distention consistent with persistent small-bowel obstruction. Intraluminal air is noted throughout the colon. No free air is identified. Electronically Signed   By: Marcello Moores  Register   On: 06/27/2020 07:49    Assessment: Small bowel obstruction versus ileus status post left robotic nephrectomy on 06/20/2020.  Plan: Continue clamped NG tube.  Advance to clear liquid diet.  Continue ambulation.  Continue  Lovenox DVT prophylaxis.   Link Snuffer, MD Urology 06/28/2020, 11:16 AM

## 2020-06-29 ENCOUNTER — Inpatient Hospital Stay (HOSPITAL_COMMUNITY): Payer: 59

## 2020-06-29 LAB — BASIC METABOLIC PANEL
Anion gap: 10 (ref 5–15)
BUN: 20 mg/dL (ref 6–20)
CO2: 19 mmol/L — ABNORMAL LOW (ref 22–32)
Calcium: 8.4 mg/dL — ABNORMAL LOW (ref 8.9–10.3)
Chloride: 103 mmol/L (ref 98–111)
Creatinine, Ser: 1.36 mg/dL — ABNORMAL HIGH (ref 0.61–1.24)
GFR calc Af Amer: >60 mL/min (ref >60)
GFR calc non Af Amer: 57 mL/min — ABNORMAL LOW (ref >60)
Glucose, Bld: 81 mg/dL (ref 70–99)
Potassium: 4.4 mmol/L (ref 3.5–5.1)
Sodium: 132 mmol/L — ABNORMAL LOW (ref 135–145)

## 2020-06-29 MED ORDER — PANTOPRAZOLE SODIUM 40 MG PO TBEC
40.0000 mg | DELAYED_RELEASE_TABLET | Freq: Every day | ORAL | Status: DC
  Administered 2020-06-29 – 2020-07-03 (×5): 40 mg via ORAL
  Filled 2020-06-29 (×5): qty 1

## 2020-06-29 NOTE — Plan of Care (Signed)
  Problem: Bowel/Gastric: Goal: Will show no signs and symptoms of gastrointestinal bleeding Outcome: Progressing   Problem: Clinical Measurements: Goal: Complications related to the disease process, condition or treatment will be avoided or minimized Outcome: Progressing   Problem: Clinical Measurements: Goal: Ability to maintain clinical measurements within normal limits will improve Outcome: Progressing Goal: Will remain free from infection Outcome: Progressing Goal: Diagnostic test results will improve Outcome: Progressing Goal: Respiratory complications will improve Outcome: Progressing Goal: Cardiovascular complication will be avoided Outcome: Progressing   Problem: Activity: Goal: Risk for activity intolerance will decrease Outcome: Progressing   Problem: Nutrition: Goal: Adequate nutrition will be maintained Outcome: Progressing   Problem: Coping: Goal: Level of anxiety will decrease Outcome: Progressing   Problem: Elimination: Goal: Will not experience complications related to bowel motility Outcome: Progressing   Problem: Pain Managment: Goal: General experience of comfort will improve Outcome: Progressing   Problem: Safety: Goal: Ability to remain free from injury will improve Outcome: Progressing   Problem: Skin Integrity: Goal: Risk for impaired skin integrity will decrease Outcome: Progressing

## 2020-06-29 NOTE — Progress Notes (Signed)
Urology Inpatient Progress Report  Oral thrush [B37.0] Small bowel obstruction ([place]) [K56.609] SBO (small bowel obstruction) ([place]) [K56.609] Encounter for imaging study to confirm nasogastric tube placement [Z01.89]        Intv/Subj: Having a small amount of flatus.  Had a small loose bowel movement.  KUB pending.  He continues to have abdominal pain and nausea at night.  Currently planning for full liquids and to keep the NG tube for today.  Active Problems:   Small bowel obstruction (HCC)  Current Facility-Administered Medications  Medication Dose Route Frequency Provider Last Rate Last Admin  . 0.45 % NaCl with KCl 20 mEq / L infusion   Intravenous Continuous Ceasar Mons, MD 100 mL/hr at 06/29/20 5573 New Bag at 06/29/20 2202  . albuterol (VENTOLIN HFA) 108 (90 Base) MCG/ACT inhaler 2 puff  2 puff Inhalation Q4H PRN Celene Squibb, MD      . amLODipine (NORVASC) tablet 5 mg  5 mg Oral QHS Celene Squibb, MD   5 mg at 06/28/20 2109  . atenolol (TENORMIN) tablet 25 mg  25 mg Oral QHS Celene Squibb, MD   25 mg at 06/28/20 2110  . buPROPion (WELLBUTRIN XL) 24 hr tablet 300 mg  300 mg Oral QHS Celene Squibb, MD   300 mg at 06/28/20 2109  . doxazosin (CARDURA) tablet 4 mg  4 mg Oral QHS Celene Squibb, MD   4 mg at 06/28/20 2109  . enoxaparin (LOVENOX) injection 40 mg  40 mg Subcutaneous Q24H Celene Squibb, MD   40 mg at 06/29/20 0914  . levothyroxine (SYNTHROID) tablet 137 mcg  137 mcg Oral QHS Ceasar Mons, MD   137 mcg at 06/28/20 2109  . LORazepam (ATIVAN) injection 0.5 mg  0.5 mg Intravenous Q6H PRN Ceasar Mons, MD   0.5 mg at 06/27/20 2007  . magic mouthwash w/lidocaine  5 mL Oral QID Earnstine Regal, PA-C   5 mL at 06/28/20 2110  . morphine 2 MG/ML injection 1-2 mg  1-2 mg Intravenous Q2H PRN Celene Squibb, MD   2 mg at 06/29/20 0451  . nystatin (MYCOSTATIN) 100000 UNIT/ML suspension 500,000 Units  5 mL Oral QID  Janith Lima, MD   500,000 Units at 06/29/20 0914  . ondansetron (ZOFRAN) injection 4 mg  4 mg Intravenous Once Celene Squibb, MD      . ondansetron [facility]) injection 4 mg  4 mg Intravenous Q6H PRN Celene Squibb, MD   4 mg at 06/28/20 2109  . pantoprazole (PROTONIX) EC tablet 40 mg  40 mg Oral Daily Janith Lima, MD   40 mg at 06/29/20 0914  . phenol (CHLORASEPTIC) mouth spray 1 spray  1 spray Mouth/Throat PRN Celene Squibb, MD   1 spray at 06/26/20 1224  . polyvinyl alcohol (LIQUIFILM TEARS) 1.4 % ophthalmic solution 1 drop  1 drop Both Eyes PRN Janith Lima, MD      . promethazine (PHENERGAN) injection 6.25 mg  6.25 mg Intravenous Q4H PRN Celene Squibb, MD   6.25 mg at 06/28/20 1609  . QUEtiapine (SEROQUEL) tablet 50 mg  50 mg Oral QHS Celene Squibb, MD   50 mg at 06/28/20 2110     Objective: Vital: Vitals:   06/28/20 0433 06/28/20 1259 06/28/20 2048 06/29/20 0606  BP: 132/88 116/88 (!) 139/96 126/80  Pulse: 65 73 68 79  Resp: 18  20 20   Temp: 97.9 F (36.6 C) 98.5 F (36.9 C) 98.3 F (36.8 C) 98.1 F (36.7 C)  TempSrc: Oral Oral Oral Oral  SpO2: 94% 96% 93% 96%  Weight:      Height:       I/Os: I/O last 3 completed shifts: In: 6064.8 [I.V.:6064.8] Out: -   Physical Exam:  General: Patient is in no apparent distress Lungs: Normal respiratory effort, chest expands symmetrically. GI: Incisions are c/d/i. The abdomen is soft and nontender without mass. Ext: lower extremities symmetric  Lab Results: No results for input(s): WBC, HGB, HCT in the last 72 hours. Recent Labs    06/27/20 0532 06/28/20 0445 06/29/20 0523  NA 135 132* 132*  K 4.2 4.0 4.4  CL 106 104 103  CO2 18* 17* 19*  GLUCOSE 68* 79 81  BUN 26* 21* 20  CREATININE 1.45* 1.27* 1.36*  CALCIUM 8.2* 8.6* 8.4*   No results for input(s): LABPT, INR in the last 72 hours. No results for input(s): LABURIN in the last 72 hours. Results for orders placed or performed during the  hospital encounter of 06/25/20  SARS Coronavirus 2 by RT PCR (hospital order, performed in [facility] hospital lab) Nasopharyngeal Nasopharyngeal Swab     Status: None   Collection Time: 06/25/20 11:08 PM   Specimen: Nasopharyngeal Swab  Result Value Ref Range Status   SARS Coronavirus 2 NEGATIVE NEGATIVE Final    Comment: (NOTE) SARS-CoV-2 target nucleic acids are NOT DETECTED.  The SARS-CoV-2 RNA is generally detectable in upper and lower respiratory specimens during the acute phase of infection. The lowest concentration of SARS-CoV-2 viral copies this assay can detect is 250 copies / mL. A negative result does not preclude SARS-CoV-2 infection and should not be used as the sole basis for treatment or other patient management decisions.  A negative result may occur with improper specimen collection / handling, submission of specimen other than nasopharyngeal swab, presence of viral mutation(s) within the areas targeted by this assay, and inadequate number of viral copies (<250 copies / mL). A negative result must be combined with clinical observations, patient history, and epidemiological information.  Fact Sheet for Patients:   StrictlyIdeas.no  Fact Sheet for Healthcare Providers: BankingDealers.co.za  This test is not yet approved or  cleared by the [place] FDA and has been authorized for detection and/or diagnosis of SARS-CoV-2 by FDA under an Emergency Use Authorization (EUA).  This EUA will remain in effect (meaning this test can be used) for the duration of the COVID-19 declaration under Section 564(b)(1) of the Act, 21 U.S.C. section 360bbb-3(b)(1), unless the authorization is terminated or revoked sooner.  Performed at [facility], [place] [address], [place], [place] [address]     Studies/Results: No results found.  Assessment: Postoperative ileus versus small bowel obstruction following  laparoscopic left nephrectomy  Plan: Agree with full liquid diet and keeping the NG tube clamped for now.  The patient has a desire to start TPN if things do not start progressing.  He is worried about nutritional status.   Link Snuffer, MD Urology 06/29/2020, 11:41 AM

## 2020-06-29 NOTE — Progress Notes (Signed)
° ° ° °  Assessment & Plan: SBO vs ileus,S/pLeft robotic nephrectomy 06/20/2020-POD#9             Tolerated NG clamped overnight, small flatus, loose BM this morning             Advance to full liquid diet today - leave NG in place at patient request  Will check AXR this AM             Encouraged OOB, ambulation  Will follow.        Armandina Gemma, MD       [facility]       Office: [phone]   Chief Complaint: Small bowel obstruction following nephrectomy  Subjective: Patient in bed.  Some nausea last PM, no emesis.  NG remained clamped overnight.  Loose BM this AM.  Objective: Vital signs in last 24 hours: Temp:  [98.1 F (36.7 C)-98.5 F (36.9 C)] 98.1 F (36.7 C) (09/26 0606) Pulse Rate:  [68-79] 79 (09/26 0606) Resp:  [20] 20 (09/26 0606) BP: (116-139)/(80-96) 126/80 (09/26 0606) SpO2:  [93 %-96 %] 96 % (09/26 0606) Last BM Date: 06/28/20  Intake/Output from previous day: 09/25 0701 - 09/26 0700 In: 6064.8 [I.V.:6064.8] Out: -  Intake/Output this shift: No intake/output data recorded.  Physical Exam: HEENT - sclerae clear, mucous membranes moist Neck - soft Chest - clear bilaterally Cor - RRR Abdomen - soft, minimal distension; non-tender except LLQ Ext - no edema, non-tender Neuro - alert & oriented, no focal deficits  Lab Results:  No results for input(s): WBC, HGB, HCT, PLT in the last 72 hours. BMET Recent Labs    06/28/20 0445 06/29/20 0523  NA 132* 132*  K 4.0 4.4  CL 104 103  CO2 17* 19*  GLUCOSE 79 81  BUN 21* 20  CREATININE 1.27* 1.36*  CALCIUM 8.6* 8.4*   PT/INR No results for input(s): LABPROT, INR in the last 72 hours. Comprehensive Metabolic Panel:    Component Value Date/Time   NA 132 (L) 06/29/2020 0523   NA 132 (L) 06/28/2020 0445   K 4.4 06/29/2020 0523   K 4.0 06/28/2020 0445   CL 103 06/29/2020 0523   CL 104 06/28/2020 0445   CO2 19 (L) 06/29/2020 0523   CO2 17 (L) 06/28/2020 0445   BUN 20  06/29/2020 0523   BUN 21 (H) 06/28/2020 0445   CREATININE 1.36 (H) 06/29/2020 0523   CREATININE 1.27 (H) 06/28/2020 0445   GLUCOSE 81 06/29/2020 0523   GLUCOSE 79 06/28/2020 0445   CALCIUM 8.4 (L) 06/29/2020 0523   CALCIUM 8.6 (L) 06/28/2020 0445   AST 22 06/25/2020 2029   AST 13 (L) 06/23/2020 0445   ALT 21 06/25/2020 2029   ALT 9 06/23/2020 0445   ALKPHOS 65 06/25/2020 2029   ALKPHOS 49 06/23/2020 0445   BILITOT 1.2 06/25/2020 2029   BILITOT 0.7 06/23/2020 0445   PROT 7.2 06/25/2020 2029   PROT 6.9 06/23/2020 0445   ALBUMIN 3.6 06/25/2020 2029   ALBUMIN 3.6 06/23/2020 0445    Studies/Results: No results found.    Armandina Gemma 06/29/2020  Patient ID: Bruce Douglas, male   DOB: 1960/12/14, 59 y.o.   MRN: [id number]

## 2020-06-30 ENCOUNTER — Inpatient Hospital Stay (HOSPITAL_COMMUNITY): Payer: 59

## 2020-06-30 LAB — BASIC METABOLIC PANEL
Anion gap: 13 (ref 5–15)
BUN: 20 mg/dL (ref 6–20)
CO2: 20 mmol/L — ABNORMAL LOW (ref 22–32)
Calcium: 8.9 mg/dL (ref 8.9–10.3)
Chloride: 100 mmol/L (ref 98–111)
Creatinine, Ser: 1.39 mg/dL — ABNORMAL HIGH (ref 0.61–1.24)
GFR calc Af Amer: >60 mL/min (ref >60)
GFR calc non Af Amer: 55 mL/min — ABNORMAL LOW (ref >60)
Glucose, Bld: 74 mg/dL (ref 70–99)
Potassium: 4.3 mmol/L (ref 3.5–5.1)
Sodium: 133 mmol/L — ABNORMAL LOW (ref 135–145)

## 2020-06-30 LAB — PREALBUMIN: Prealbumin: 18 mg/dL (ref 18–38)

## 2020-06-30 NOTE — Progress Notes (Signed)
  Subjective: NAEO.  NG in place, but no to suction.  He reports nausea s/o vomiting even with small amounts of broth or ice cream. Passing a small amount of flatus and had a small BM yesterday.  Denies abdominal pain this AM.  KUB continues to show ileus.   Objective: Vital signs in last 24 hours: Temp:  [98 F (36.7 C)-98.3 F (36.8 C)] 98 F (36.7 C) (09/27 0546) Pulse Rate:  [60-71] 60 (09/27 0546) Resp:  [18-20] 20 (09/27 0546) BP: (104-134)/(75-91) 104/75 (09/27 0546) SpO2:  [95 %-97 %] 95 % (09/27 0546)  Intake/Output from previous day: 09/26 0701 - 09/27 0700 In: 1800.2 [I.V.:1800.2] Out: -   Intake/Output this shift: No intake/output data recorded.  Physical Exam:  General: Alert and oriented CV: RRR, palpable distal pulses Lungs: CTAB, equal chest rise Abdomen: Soft, NTND, no rebound or guarding, NG in place Incisions: c/d/i Ext: NT, No erythema  Lab Results: No results for input(s): HGB, HCT in the last 72 hours. BMET Recent Labs    06/29/20 0523 06/30/20 0455  NA 132* 133*  K 4.4 4.3  CL 103 100  CO2 19* 20*  GLUCOSE 81 74  BUN 20 20  CREATININE 1.36* 1.39*  CALCIUM 8.4* 8.9     Studies/Results: DG Abd 1 View  Result Date: 06/30/2020 CLINICAL DATA:  Ileus EXAM: ABDOMEN - 1 VIEW COMPARISON:  06/29/2020 FINDINGS: Enteric tube remains unchanged in position. Increasing gaseous distention of small bowel since prior study. Scattered gas in the colon. Changes could represent small bowel obstruction or ileus. IMPRESSION: Increasing gaseous distention of small bowel since prior study. Changes could represent small bowel obstruction or ileus. Electronically Signed   By: Lucienne Capers M.D.   On: 06/30/2020 06:00   DG Abd 2 Views  Result Date: 06/29/2020 CLINICAL DATA:  Small-bowel obstruction. EXAM: X-RAY ABDOMEN 2 VIEWS COMPARISON:  06/27/2020 FINDINGS: An NG tube is identified with tip overlying the distal stomach/proximal duodenum. Mildly distended  gas-filled loops of small bowel are slightly decreased in caliber. Gas and stool in the colon identified. No other changes noted. Subcutaneous emphysema along the LATERAL LEFT abdomen noted. IMPRESSION: 1. NG tube with tip overlying the distal stomach/proximal duodenum. 2. Slightly decreased small bowel distention. Electronically Signed   By: Margarette Canada M.D.   On: 06/29/2020 11:45    Assessment/Plan: 59 year old male postop day 9 status post robotic left radical nephrectomy now with postoperative ileus/small bowel obstruction  -I will defer the decision to start TPN to the general surgery team.   -OOBTC and ambulate -Unless he starts vomiting, keep NGT off of suction   LOS: 5 days   Ellison Hughs, MD [facility] Pager: [phone]  06/30/2020, 8:06 AM

## 2020-06-30 NOTE — Progress Notes (Addendum)
CC: abdominal pain  Subjective: NG clamped on 9/24, and pt says he has not had any nausea or vomiting with NG clamped.  He is on full liquids, but says he has pain with any PO intake.  He reports he has not ask for anything for pain after taking PO's.   Objective: Vital signs in last 24 hours: Temp:  [98 F (36.7 C)-98.3 F (36.8 C)] 98 F (36.7 C) (09/27 0546) Pulse Rate:  [60-71] 60 (09/27 0546) Resp:  [18-20] 20 (09/27 0546) BP: (104-134)/(75-91) 104/75 (09/27 0546) SpO2:  [95 %-97 %] 95 % (09/27 0546) Last BM Date: 06/29/20 1800 IV Urine x4 Nothing p.o. recordedm - he says he gets pain with just teaspoons full of fluids No stool recorded yesterday one this AM. Afebrile vital signs are stable A.m. labs: Creatinine 1.39, potassium 4.3 Meds:  Wellbutrin, synthroid, magic mouth wash/nystatin, no phenergan x 48 hrs Seroquel HS,Morphine x 2 yesterday; Norvasc/Tenormin/synthroid  Intake/Output from previous day: 09/26 0701 - 09/27 0700 In: 1800.2 [I.V.:1800.2] Out: -  Intake/Output this shift: No intake/output data recorded.  General appearance: alert, cooperative and no distress Resp: clear to auscultation bilaterally GI: soft, he is not distened, no abdominal pain, + BS, + BM, no nausea or vomiting  Lab Results:  No results for input(s): WBC, HGB, HCT, PLT in the last 72 hours.  BMET Recent Labs    06/29/20 0523 06/30/20 0455  NA 132* 133*  K 4.4 4.3  CL 103 100  CO2 19* 20*  GLUCOSE 81 74  BUN 20 20  CREATININE 1.36* 1.39*  CALCIUM 8.4* 8.9   PT/INR No results for input(s): LABPROT, INR in the last 72 hours.  Recent Labs  Lab 06/25/20 2029  AST 22  ALT 21  ALKPHOS 65  BILITOT 1.2  PROT 7.2  ALBUMIN 3.6     Lipase     Component Value Date/Time   LIPASE 67 (H) 06/25/2020 2029     Medications: . amLODipine  5 mg Oral QHS  . atenolol  25 mg Oral QHS  . buPROPion  300 mg Oral QHS  . doxazosin  4 mg Oral QHS  . enoxaparin (LOVENOX)  injection  40 mg Subcutaneous Q24H  . levothyroxine  137 mcg Oral QHS  . magic mouthwash w/lidocaine  5 mL Oral QID  . nystatin  5 mL Oral QID  . ondansetron (ZOFRAN) IV  4 mg Intravenous Once  . pantoprazole  40 mg Oral Daily  . QUEtiapine  50 mg Oral QHS   . 0.45 % NaCl with KCl 20 mEq / L 100 mL/hr at 06/30/20 0153    Assessment/Plan CKD -Creatinine 1.45 Hypertension Hypothyroid GERD Asthma Hx anxiety/depression Hx nephrolithiasis Mild malnutrition - prealbumin 18  SBO vs ileus, S/pLeft robotic nephrectomy 06/20/2020-Dr. Harrell Gave [place]-POD# 7 -film looks better today with air throughout his colon.  He is passing flatus -will clamp NGT And allow ice and few sips and see how he tolerates this today.  If he tolerates well, will likely DC NGT tomorrow -mobilize/pulm toilet  FEN: full liquids/IV fluids/NGT clamped ID: None DVT: Lovenox  Plan:  I recommended he try full liquids again, it is a much better way to provide him nutrition than with IV fluids or TPN.  He seems very anxious over his nutrition status and apparently pt and family are talking about TPN to urology.  If he has pain with PO's we could treat the pain and see how well he actually tolerates PO's.  I will also recommend he be ambulated in the halls every 4 hours to assist with GI motility issues.  I have ask Dietician to see if they can also help. Prealbumin 18 (18-38 Normal.)      LOS: 5 days    Ellianna Ruest 06/30/2020 Please see Amion

## 2020-07-01 LAB — HEPATIC FUNCTION PANEL
ALT: 24 U/L (ref 0–44)
AST: 22 U/L (ref 15–41)
Albumin: 3.4 g/dL — ABNORMAL LOW (ref 3.5–5.0)
Alkaline Phosphatase: 56 U/L (ref 38–126)
Bilirubin, Direct: 0.1 mg/dL (ref 0.0–0.2)
Indirect Bilirubin: 0.3 mg/dL (ref 0.3–0.9)
Total Bilirubin: 0.4 mg/dL (ref 0.3–1.2)
Total Protein: 6.7 g/dL (ref 6.5–8.1)

## 2020-07-01 LAB — BASIC METABOLIC PANEL
Anion gap: 9 (ref 5–15)
BUN: 19 mg/dL (ref 6–20)
CO2: 22 mmol/L (ref 22–32)
Calcium: 8.8 mg/dL — ABNORMAL LOW (ref 8.9–10.3)
Chloride: 99 mmol/L (ref 98–111)
Creatinine, Ser: 1.39 mg/dL — ABNORMAL HIGH (ref 0.61–1.24)
GFR calc Af Amer: >60 mL/min (ref >60)
GFR calc non Af Amer: 55 mL/min — ABNORMAL LOW (ref >60)
Glucose, Bld: 112 mg/dL — ABNORMAL HIGH (ref 70–99)
Potassium: 4.1 mmol/L (ref 3.5–5.1)
Sodium: 130 mmol/L — ABNORMAL LOW (ref 135–145)

## 2020-07-01 LAB — LIPASE, BLOOD: Lipase: 247 U/L — ABNORMAL HIGH (ref 11–51)

## 2020-07-01 NOTE — Progress Notes (Signed)
Subjective: He feels better today. NGT out and he is glad.  Tolerating full liquids. BM yesterday.  Minimal flatus today.  He is ambulating every few hours and make at least 3 laps around the unit each time.   Objective: Vital signs in last 24 hours: Temp:  [97.7 F (36.5 C)-98.6 F (37 C)] 97.7 F (36.5 C) (09/28 0544) Pulse Rate:  [62-69] 62 (09/28 0544) Resp:  [16-18] 18 (09/28 0544) BP: (120-134)/(80-91) 122/80 (09/28 0544) SpO2:  [94 %-98 %] 96 % (09/28 0544)  Intake/Output from previous day: 09/27 0701 - 09/28 0700 In: 2353.5 [P.O.:120; I.V.:2233.5] Out: -  Intake/Output this shift: No intake/output data recorded.  Physical Exam:  General:alert, cooperative and no distress GI: soft, non tender, normal bowel sounds, no palpable masses Incisions healing well  Lab Results: No results for input(s): HGB, HCT in the last 72 hours. BMET Recent Labs    06/30/20 0455 07/01/20 0512  NA 133* 130*  K 4.3 4.1  CL 100 99  CO2 20* 22  GLUCOSE 74 112*  BUN 20 19  CREATININE 1.39* 1.39*  CALCIUM 8.9 8.8*   No results for input(s): LABPT, INR in the last 72 hours. No results for input(s): LABURIN in the last 72 hours. Results for orders placed or performed during the hospital encounter of 06/25/20  SARS Coronavirus 2 by RT PCR (hospital order, performed in [facility] hospital lab) Nasopharyngeal Nasopharyngeal Swab     Status: None   Collection Time: 06/25/20 11:08 PM   Specimen: Nasopharyngeal Swab  Result Value Ref Range Status   SARS Coronavirus 2 NEGATIVE NEGATIVE Final    Comment: (NOTE) SARS-CoV-2 target nucleic acids are NOT DETECTED.  The SARS-CoV-2 RNA is generally detectable in upper and lower respiratory specimens during the acute phase of infection. The lowest concentration of SARS-CoV-2 viral copies this assay can detect is 250 copies / mL. A negative result does not preclude SARS-CoV-2 infection and should not be used as the sole basis for treatment or  other patient management decisions.  A negative result may occur with improper specimen collection / handling, submission of specimen other than nasopharyngeal swab, presence of viral mutation(s) within the areas targeted by this assay, and inadequate number of viral copies (<250 copies / mL). A negative result must be combined with clinical observations, patient history, and epidemiological information.  Fact Sheet for Patients:   StrictlyIdeas.no  Fact Sheet for Healthcare Providers: BankingDealers.co.za  This test is not yet approved or  cleared by the [place] FDA and has been authorized for detection and/or diagnosis of SARS-CoV-2 by FDA under an Emergency Use Authorization (EUA).  This EUA will remain in effect (meaning this test can be used) for the duration of the COVID-19 declaration under Section 564(b)(1) of the Act, 21 U.S.C. section 360bbb-3(b)(1), unless the authorization is terminated or revoked sooner.  Performed at [facility], [place] [address], [place], [place] [address]     Studies/Results: DG Abd 1 View  Result Date: 06/30/2020 CLINICAL DATA:  Ileus EXAM: ABDOMEN - 1 VIEW COMPARISON:  06/29/2020 FINDINGS: Enteric tube remains unchanged in position. Increasing gaseous distention of small bowel since prior study. Scattered gas in the colon. Changes could represent small bowel obstruction or ileus. IMPRESSION: Increasing gaseous distention of small bowel since prior study. Changes could represent small bowel obstruction or ileus. Electronically Signed   By: Lucienne Capers M.D.   On: 06/30/2020 06:00    Assessment/Plan:   POD 10 s/p RA left  rad nephrectomy  Post op ileus/SBO--slowly improving.  Diet per gen surg. Full liquids for now.  Ambulate q 2  IS  Follow lipase as it is elevated today  Per pt and his wife is he supposed to have a shoulder surgery in the near future.  They want to  know what the timing of this should be.  Advised them to speak with gen surgery for recommendations.    LOS: 6 days   Debbrah Alar 07/01/2020, 12:07 PM

## 2020-07-01 NOTE — Progress Notes (Addendum)
    CC: nausea and emesis   Subjective: He has had some BM's and wants to pull the NG and advance diet.  He is taking full liquids, and does not associate it with worsening pain this AM.  Objective: Vital signs in last 24 hours: Temp:  [97.7 F (36.5 C)-98.6 F (37 C)] 97.7 F (36.5 C) (09/28 0544) Pulse Rate:  [62-69] 62 (09/28 0544) Resp:  [16-18] 18 (09/28 0544) BP: (120-134)/(80-91) 122/80 (09/28 0544) SpO2:  [94 %-98 %] 96 % (09/28 0544) Last BM Date: 06/30/20 120 p.o. recorded 2033 IV recorded Voided x5 BM x3 Afebrile vital signs are stable Creatinine is stable at 1.39.  Lipase elevated at 247 LFTs are normal Lipase elevated 247   Intake/Output from previous day: 09/27 0701 - 09/28 0700 In: 2353.5 [P.O.:120; I.V.:2233.5] Out: -  Intake/Output this shift: No intake/output data recorded.  General appearance: alert, cooperative and no distress GI: soft, non-tender; bowel sounds normal; no masses,  no organomegaly and still has some mid abdominal pain.    Lab Results:  No results for input(s): WBC, HGB, HCT, PLT in the last 72 hours.  BMET Recent Labs    06/30/20 0455 07/01/20 0512  NA 133* 130*  K 4.3 4.1  CL 100 99  CO2 20* 22  GLUCOSE 74 112*  BUN 20 19  CREATININE 1.39* 1.39*  CALCIUM 8.9 8.8*   PT/INR No results for input(s): LABPROT, INR in the last 72 hours.  Recent Labs  Lab 06/25/20 2029 07/01/20 0512  AST 22 22  ALT 21 24  ALKPHOS 65 56  BILITOT 1.2 0.4  PROT 7.2 6.7  ALBUMIN 3.6 3.4*     Lipase     Component Value Date/Time   LIPASE 247 (H) 07/01/2020 4166     Medications: . amLODipine  5 mg Oral QHS  . atenolol  25 mg Oral QHS  . buPROPion  300 mg Oral QHS  . doxazosin  4 mg Oral QHS  . enoxaparin (LOVENOX) injection  40 mg Subcutaneous Q24H  . levothyroxine  137 mcg Oral QHS  . magic mouthwash w/lidocaine  5 mL Oral QID  . nystatin  5 mL Oral QID  . ondansetron (ZOFRAN) IV  4 mg Intravenous Once  . pantoprazole  40  mg Oral Daily  . QUEtiapine  50 mg Oral QHS    Assessment/Plan CKD -Creatinine 1.45 Hypertension Hypothyroid GERD Asthma Hx anxiety/depression Hx nephrolithiasis Mild malnutrition - prealbumin 18  SBO vs ileus,S/pLeft robotic nephrectomy 06/20/2020-Dr. Harrell Gave [place]-POD#7 -film looks better today with air throughout his colon. He is passing flatus -will clamp NGT And allow ice and few sips and see how he tolerates this today. If he tolerates well, will likely DC NGT tomorrow -mobilize/pulm toilet  - lipase elevated at 247    FEN: full liquids/IV fluids/NGT clamped ID: None DVT: Lovenox  Plan:  Agree with pulling NG, I am going to leave him on liquids for now.  Recheck labs tomorrow also. His lipase is up so he may have some pancreatitis from this.      LOS: 6 days    Velecia Ovitt 07/01/2020 Please see Amion

## 2020-07-02 LAB — COMPREHENSIVE METABOLIC PANEL
ALT: 21 U/L (ref 0–44)
AST: 17 U/L (ref 15–41)
Albumin: 3.2 g/dL — ABNORMAL LOW (ref 3.5–5.0)
Alkaline Phosphatase: 49 U/L (ref 38–126)
Anion gap: 8 (ref 5–15)
BUN: 13 mg/dL (ref 6–20)
CO2: 25 mmol/L (ref 22–32)
Calcium: 8.9 mg/dL (ref 8.9–10.3)
Chloride: 101 mmol/L (ref 98–111)
Creatinine, Ser: 1.33 mg/dL — ABNORMAL HIGH (ref 0.61–1.24)
GFR calc Af Amer: >60 mL/min (ref >60)
GFR calc non Af Amer: 58 mL/min — ABNORMAL LOW (ref >60)
Glucose, Bld: 105 mg/dL — ABNORMAL HIGH (ref 70–99)
Potassium: 4.4 mmol/L (ref 3.5–5.1)
Sodium: 134 mmol/L — ABNORMAL LOW (ref 135–145)
Total Bilirubin: 0.2 mg/dL — ABNORMAL LOW (ref 0.3–1.2)
Total Protein: 6.5 g/dL (ref 6.5–8.1)

## 2020-07-02 LAB — CBC
HCT: 38.9 % — ABNORMAL LOW (ref 39.0–52.0)
Hemoglobin: 13.3 g/dL (ref 13.0–17.0)
MCH: 32.5 pg (ref 26.0–34.0)
MCHC: 34.2 g/dL (ref 30.0–36.0)
MCV: 95.1 fL (ref 80.0–100.0)
Platelets: 390 10*3/uL (ref 150–400)
RBC: 4.09 MIL/uL — ABNORMAL LOW (ref 4.22–5.81)
RDW: 12.3 % (ref 11.5–15.5)
WBC: 11.9 10*3/uL — ABNORMAL HIGH (ref 4.0–10.5)
nRBC: 0 % (ref 0.0–0.2)

## 2020-07-02 LAB — LIPASE, BLOOD: Lipase: 142 U/L — ABNORMAL HIGH (ref 11–51)

## 2020-07-02 MED ORDER — DOCUSATE SODIUM 100 MG PO CAPS
100.0000 mg | ORAL_CAPSULE | Freq: Two times a day (BID) | ORAL | Status: DC
  Administered 2020-07-02: 100 mg via ORAL
  Filled 2020-07-02 (×2): qty 1

## 2020-07-02 MED ORDER — ADULT MULTIVITAMIN W/MINERALS CH
1.0000 | ORAL_TABLET | Freq: Every day | ORAL | Status: DC
  Administered 2020-07-02 – 2020-07-03 (×2): 1 via ORAL
  Filled 2020-07-02 (×2): qty 1

## 2020-07-02 MED ORDER — KATE FARMS STANDARD 1.4 PO LIQD
325.0000 mL | Freq: Every day | ORAL | Status: DC
  Filled 2020-07-02 (×2): qty 325

## 2020-07-02 MED ORDER — POLYETHYLENE GLYCOL 3350 17 G PO PACK: 17.0000 g | PACK | Freq: Every day | ORAL | Status: DC | PRN

## 2020-07-02 NOTE — Plan of Care (Signed)
  Problem: Clinical Measurements: Goal: Complications related to the disease process, condition or treatment will be avoided or minimized Outcome: Progressing   Problem: Clinical Measurements: Goal: Ability to maintain clinical measurements within normal limits will improve Outcome: Progressing Goal: Will remain free from infection Outcome: Progressing Goal: Diagnostic test results will improve Outcome: Progressing Goal: Respiratory complications will improve Outcome: Progressing Goal: Cardiovascular complication will be avoided Outcome: Progressing   Problem: Elimination: Goal: Will not experience complications related to bowel motility Outcome: Progressing

## 2020-07-02 NOTE — Progress Notes (Signed)
[facility] Progress Note     Subjective: Patient reports some abdominal soreness along L abdomen. Denies nausea or vomiting. Having bowel function. Advanced to soft diet already this AM. Mobilizing well.   Objective: Vital signs in last 24 hours: Temp:  [98.2 F (36.8 C)-98.5 F (36.9 C)] 98.4 F (36.9 C) (09/29 0605) Pulse Rate:  [61-67] 67 (09/29 0605) Resp:  [16-20] 16 (09/29 0605) BP: (107-121)/(75-87) 121/75 (09/29 0605) SpO2:  [96 %-97 %] 96 % (09/29 0605) Last BM Date: 07/01/20  Intake/Output from previous day: 09/28 0701 - 09/29 0700 In: 1376.8 [I.V.:1376.8] Out: -  Intake/Output this shift: No intake/output data recorded.  PE: General: pleasant, WD, thin male who is laying in bed in NAD Lungs: Respiratory effort nonlabored Abd: soft, NT, ND, +BS, incisions c/d/i   Lab Results:  Recent Labs    07/02/20 0422  WBC 11.9*  HGB 13.3  HCT 38.9*  PLT 390   BMET Recent Labs    07/01/20 0512 07/02/20 0422  NA 130* 134*  K 4.1 4.4  CL 99 101  CO2 22 25  GLUCOSE 112* 105*  BUN 19 13  CREATININE 1.39* 1.33*  CALCIUM 8.8* 8.9   PT/INR No results for input(s): LABPROT, INR in the last 72 hours. CMP     Component Value Date/Time   NA 134 (L) 07/02/2020 0422   K 4.4 07/02/2020 0422   CL 101 07/02/2020 0422   CO2 25 07/02/2020 0422   GLUCOSE 105 (H) 07/02/2020 0422   BUN 13 07/02/2020 0422   CREATININE 1.33 (H) 07/02/2020 0422   CALCIUM 8.9 07/02/2020 0422   PROT 6.5 07/02/2020 0422   ALBUMIN 3.2 (L) 07/02/2020 0422   AST 17 07/02/2020 0422   ALT 21 07/02/2020 0422   ALKPHOS 49 07/02/2020 0422   BILITOT 0.2 (L) 07/02/2020 0422   GFRNONAA 58 (L) 07/02/2020 0422   GFRAA >60 07/02/2020 0422   Lipase     Component Value Date/Time   LIPASE 142 (H) 07/02/2020 0422       Studies/Results: No results found.  Anti-infectives: Anti-infectives (From admission, onward)   None       Assessment/Plan CKD - Creatinine  1.33 Hypertension Hypothyroid GERD Asthma Hx anxiety/depression Hx nephrolithiasis Mild malnutrition - prealbumin 18 (9/27)  SBO vs Ileus S/pLeft robotic nephrectomy 06/20/2020-Dr. Winters-POD#12 - abdominal exam benign - tolerating FLD and having bowel function - advanced to soft diet this AM - no indication for surgical intervention - general surgery will sign off at this time, please call as needed with concerns  MEB:RAXE diet  ID: None DVT: Lovenox  LOS: 7 days    Norm Parcel , [facility] 07/02/2020, 10:25 AM Please see Amion for pager number during day hours 7:00am-4:30pm

## 2020-07-02 NOTE — Progress Notes (Signed)
  Subjective: NAEO.  NGT out.  Tolerating full liquids, passing flatus and had a small BM yesterday.  Denies nausea/vomiting since NGT was pulled. Overall, feeling better.    Objective: Vital signs in last 24 hours: Temp:  [98.2 F (36.8 C)-98.5 F (36.9 C)] 98.4 F (36.9 C) (09/29 0605) Pulse Rate:  [61-67] 67 (09/29 0605) Resp:  [16-20] 16 (09/29 0605) BP: (107-121)/(75-87) 121/75 (09/29 0605) SpO2:  [96 %-97 %] 96 % (09/29 0605)  Intake/Output from previous day: 09/28 0701 - 09/29 0700 In: 1376.8 [I.V.:1376.8] Out: -   Intake/Output this shift: No intake/output data recorded.  Physical Exam:  General: Alert and oriented CV: RRR, palpable distal pulses Lungs: CTAB, equal chest rise Abdomen: Soft, NTND, no rebound or guarding Incisions: c/d/i Ext: NT, No erythema  Lab Results: Recent Labs    07/02/20 0422  HGB 13.3  HCT 38.9*   BMET Recent Labs    07/01/20 0512 07/02/20 0422  NA 130* 134*  K 4.1 4.4  CL 99 101  CO2 22 25  GLUCOSE 112* 105*  BUN 19 13  CREATININE 1.39* 1.33*  CALCIUM 8.8* 8.9     Studies/Results: No results found.  Assessment/Plan: 59 year old male postop day 11 status post robotic left radical nephrectomy now with postoperative ileus/small bowel obstruction  -ADAT -d/c IVF -OOBTC and ambulate -Lovenox for DVTp -Repeat KUB tomorrow -General surgery following elevated lipase -If he continues to progress, will possibly d/c tomorrow afternoon.    LOS: 7 days   Ellison Hughs, MD [facility] Pager: [phone]  07/02/2020, 10:06 AM

## 2020-07-02 NOTE — Progress Notes (Signed)
Initial Nutrition Assessment  DOCUMENTATION CODES:   Non-severe (moderate) malnutrition in context of chronic illness  INTERVENTION:  - will Will order [employer] 1.4 po once/day, each supplement provides 455 kcal and 20 grams protein.  - will order 1 tablet multivitamin with minerals. - verbal diet education provided to patient and his wife.  * weigh patient today as he has not been weighed since 9/22.  NUTRITION DIAGNOSIS:   Moderate Malnutrition related to chronic illness as evidenced by moderate fat depletion, moderate muscle depletion.  GOAL:   Patient will meet greater than or equal to 90% of their needs  MONITOR:   PO intake, Supplement acceptance, Labs, Weight trends  REASON FOR ASSESSMENT:   Consult Assessment of nutrition requirement/status (abdominal pain with liquids)  ASSESSMENT:   59 y.o. male s/p robotic left radical nephrectomy for L renal mass with post-operative course complicated by N/V. He was discharged home and returned 12 hours later d/t abdominal pain and weakness. In the ED he was found to have SBO. Patient has medical history of HTN, asthma, GERD, hypothyroidism, kidney stones, anxiety, depression, and arthritis.  Diet advanced from NPO to CLD on 9/25, to [place] on 9/26, and to Heart Healthy/Carb Modified today at 0934. He previously had NGT to LIS which has been removed with diet advancement.   Patient laying in bed and his wife is at bedside. Patient had 4 of 5 small pancakes for breakfast this AM. He denies any pain/discomfort or nausea with eating. He has ordered lunch and is waiting for it to arrive (ordered spaghetti).   He states that for the past 1 year he has been using high doses of Ibuprofen daily d/t R rotator cuff pain. During that time it has been usual for him to go a day or a day and a half without a BM and when he does have a BM, it tends to be very hard.   Patient is R-handed and does experience some limitations and discomfort when using  R hand to self-feed and perform other ADLs but is still able to do these tasks independently.   Wife asks about diet at home. Discussed a low fiber diet and the best options from various food groups that are in line with this diet. Also discussed importance of adequate hydration and ways to increase fluid intake.   Weight on 9/22 (the most recently documented weight) was 148 lb and weight on 9/7 was 149 lb. No other weight hx available.    Labs reviewed; Na: 134 mmol/l, creatinine: 1.33 mg/dl, lipase: 142 U/L (down from 9/28), GFR: 58 ml/min. Medications reviewed; 100 mg colace BID, 137 mcg oral synthroid/day, 40 mg oral protonix/day.     NUTRITION - FOCUSED PHYSICAL EXAM:    Most Recent Value  Orbital Region Moderate depletion  Upper Arm Region Moderate depletion  Thoracic and Lumbar Region Unable to assess  Buccal Region Moderate depletion  Temple Region Moderate depletion  Clavicle Bone Region Moderate depletion  Clavicle and Acromion Bone Region Moderate depletion  Scapular Bone Region Unable to assess  Dorsal Hand Mild depletion  Patellar Region Unable to assess  Anterior Thigh Region Unable to assess  Posterior Calf Region Unable to assess  Edema (RD Assessment) Unable to assess  Hair Reviewed  Eyes Reviewed  Mouth Reviewed  Skin Reviewed  Nails Reviewed       Diet Order:   Diet Order            Diet heart healthy/carb modified Room service  appropriate? Yes; Fluid consistency: Thin  Diet effective now                 EDUCATION NEEDS:   No education needs have been identified at this time  Skin:  Skin Assessment: Reviewed RN Assessment  Last BM:  9/28  Height:   Ht Readings from Last 1 Encounters:  06/25/20 5\' 10"  (1.778 m)    Weight:   Wt Readings from Last 1 Encounters:  06/25/20 67 kg    Estimated Nutritional Needs:   Kcal:  2010-2210 kcal Protein:  100-110 grams Fluid:  >/= 2.2 L/day     Jarome Matin, MS, RD, LDN, CNSC Inpatient  Clinical Dietitian RD pager # available in AMION  After hours/weekend pager # available in [facility]

## 2020-07-03 ENCOUNTER — Inpatient Hospital Stay (HOSPITAL_COMMUNITY): Payer: 59

## 2020-07-03 DIAGNOSIS — E440 Moderate protein-calorie malnutrition: Secondary | ICD-10-CM | POA: Insufficient documentation

## 2020-07-03 LAB — BASIC METABOLIC PANEL
Anion gap: 11 (ref 5–15)
BUN: 15 mg/dL (ref 6–20)
CO2: 23 mmol/L (ref 22–32)
Calcium: 9.1 mg/dL (ref 8.9–10.3)
Chloride: 99 mmol/L (ref 98–111)
Creatinine, Ser: 1.44 mg/dL — ABNORMAL HIGH (ref 0.61–1.24)
GFR calc Af Amer: >60 mL/min (ref >60)
GFR calc non Af Amer: 53 mL/min — ABNORMAL LOW (ref >60)
Glucose, Bld: 103 mg/dL — ABNORMAL HIGH (ref 70–99)
Potassium: 4.1 mmol/L (ref 3.5–5.1)
Sodium: 133 mmol/L — ABNORMAL LOW (ref 135–145)

## 2020-07-03 NOTE — Progress Notes (Signed)
Patient's IV removed.  Site WNL.  AVS reviewed with patient.  Verbalized understanding of discharge instructions, physician follow-up, medications.  Patient transported by NT via wheelchair to main entrance at discharge.  Patient stable at time of discharge.

## 2020-07-03 NOTE — Progress Notes (Signed)
  Subjective: NAEO.  Tolerating a regular diet w/o nausea, vomiting or abdominal pain.  Passing flatus and had a small BM yesterday.  KUB looks markedly improved.   Objective: Vital signs in last 24 hours: Temp:  [98.5 F (36.9 C)-98.9 F (37.2 C)] 98.5 F (36.9 C) (09/30 0542) Pulse Rate:  [67-72] 67 (09/30 0542) Resp:  [18] 18 (09/29 2028) BP: (101-102)/(71-79) 101/71 (09/30 0542) SpO2:  [95 %-97 %] 95 % (09/30 0542) Weight:  [59.1 kg] 59.1 kg (09/29 1244)  Intake/Output from previous day: No intake/output data recorded.  Intake/Output this shift: No intake/output data recorded.  Physical Exam:  General: Alert and oriented CV: RRR, palpable distal pulses Lungs: CTAB, equal chest rise Abdomen: Soft, NTND, no rebound or guarding Incisions: c/d/i Ext: NT, No erythema  Lab Results: Recent Labs    07/02/20 0422  HGB 13.3  HCT 38.9*   BMET Recent Labs    07/02/20 0422 07/03/20 0537  NA 134* 133*  K 4.4 4.1  CL 101 99  CO2 25 23  GLUCOSE 105* 103*  BUN 13 15  CREATININE 1.33* 1.44*  CALCIUM 8.9 9.1     Studies/Results: DG Abd 1 View  Result Date: 07/03/2020 CLINICAL DATA:  Ileus EXAM: ABDOMEN - 1 VIEW COMPARISON:  06/30/2020 FINDINGS: Interval removal of the enteric tube. Gas-filled small bowel with decreased diameter since previous study suggesting interval improvement. Small bowel fold thickening may indicate enteritis. Scattered gas and stool within the colon. No colonic distention. No radiopaque stones. IMPRESSION: Improving small bowel distention since previous study. Small bowel fold thickening may indicate enteritis. Electronically Signed   By: Lucienne Capers M.D.   On: 07/03/2020 05:51    Assessment/Plan: 59 year old male postop day 12status post robotic left radical nephrectomy now with postoperative ileus/small bowel obstruction  -Ileus improving -ADAT -OOBTC and ambulate -Lovenox for DVTp -Likely home this afternoon    LOS: 8 days    Ellison Hughs, MD [facility] Pager: [phone]  07/03/2020, 8:26 AM

## 2020-07-03 NOTE — Discharge Instructions (Signed)

## 2020-07-07 NOTE — Discharge Summary (Signed)
Date of admission: 06/25/2020  Date of discharge: 07/03/20  Admission diagnosis: Small bowel obstruction  Discharge diagnosis: same  Secondary diagnoses: renal cancer, anxiety, asthma, GERD, HTN, hypothyroidism  History and Physical: For full details, please see admission history and physical. Briefly, Bruce Douglas is a 59 y.o. year old patient POD 5 s/p robotic left radical nephrectomy with post op course complicated by N/V that improved prior to d/c home on POD 4. He then returned to the hospital with c/o persistent N/V and diarrhea.    Hospital Course: Pt was admitted on 06/25/20 with persistent N/V and CT that demonstrated distal partial vs developing complete SBO.  He had an NGT placed, IVF were started, he was given prn anti-emetics, and lovenox started.  Gen surg was consulted on 06/26/20 with no treatment changes. Pt ambulated regularly during his hospital stay.  Return of bowel function was slow, however, his NGT was clamped and then d/c'd on 07/01/20.  His diet was advanced as tolerated and he was having regular BMs. He was felt stable for d/c home on 07/03/20.  Laboratory values: No results for input(s): HGB, HCT in the last 72 hours. No results for input(s): CREATININE in the last 72 hours.  Disposition: Home  Discharge instruction: The patient was instructed to be ambulatory but told to refrain from heavy lifting, strenuous activity, or driving.   Discharge medications:  Allergies as of 07/03/2020   No Known Allergies     Medication List    TAKE these medications   acetaminophen 500 MG tablet Commonly known as: TYLENOL Take 1,000 mg by mouth in the morning and at bedtime. Take with celecoxib   albuterol 108 (90 Base) MCG/ACT inhaler Commonly known as: VENTOLIN HFA Inhale 2 puffs into the lungs every 4 (four) hours as needed.   amLODipine 5 MG tablet Commonly known as: NORVASC Take 5 mg by mouth at bedtime.   atenolol 25 MG tablet Commonly known as: TENORMIN Take 25  mg by mouth at bedtime.   buPROPion 300 MG 24 hr tablet Commonly known as: WELLBUTRIN XL Take 300 mg by mouth at bedtime.   cyclobenzaprine 10 MG tablet Commonly known as: FLEXERIL Take 10 mg by mouth See admin instructions. Take 1 tablet (10 mg) by mouth scheduled at bedtime, may take an additional dose during the day if needed for back exertion.   docusate sodium 100 MG capsule Commonly known as: COLACE Take 1 capsule (100 mg total) by mouth 2 (two) times daily.   doxazosin 4 MG tablet Commonly known as: CARDURA Take 4 mg by mouth at bedtime.   HYDROcodone-acetaminophen 5-325 MG tablet Commonly known as: Norco Take 1-2 tablets by mouth every 6 (six) hours as needed for moderate pain.   levothyroxine 137 MCG tablet Commonly known as: SYNTHROID Take 137 mcg by mouth at bedtime.   omeprazole 20 MG capsule Commonly known as: PRILOSEC Take 40 mg by mouth at bedtime.   promethazine 12.5 MG tablet Commonly known as: PHENERGAN Take 1 tablet (12.5 mg total) by mouth every 4 (four) hours as needed for nausea or vomiting.   QUEtiapine 50 MG tablet Commonly known as: SEROQUEL Take 50 mg by mouth at bedtime.   varenicline 1 MG tablet Commonly known as: CHANTIX Take 1 mg by mouth 2 (two) times daily.       Followup:   Follow-up Information    Ceasar Mons, MD Follow up.   Specialty: Urology Why: office will call you with date and time of appt.  Contact information: [address] [place] [address] [phone]

## 2021-03-10 ENCOUNTER — Ambulatory Visit (HOSPITAL_COMMUNITY)
Admission: RE | Admit: 2021-03-10 | Discharge: 2021-03-10 | Disposition: A | Discharge status: Home / Self Care | Payer: 59 | Source: Ambulatory Visit | Attending: Urology | Admitting: Urology

## 2021-03-10 ENCOUNTER — Other Ambulatory Visit: Payer: Self-pay

## 2021-03-10 ENCOUNTER — Other Ambulatory Visit (HOSPITAL_COMMUNITY): Payer: Self-pay | Admitting: Urology

## 2021-03-10 DIAGNOSIS — D49512 Neoplasm of unspecified behavior of left kidney: Secondary | ICD-10-CM | POA: Diagnosis not present

## 2021-03-16 ENCOUNTER — Telehealth: Payer: Self-pay | Admitting: Gastroenterology

## 2021-03-16 NOTE — Telephone Encounter (Signed)
Good morning Dr. Lyndel Safe, we received a referral for patient to have colonoscopy.  Is last colon was in 2017.  Will be faxing records to [facility].  Can you please review and advise on scheduling?  Thank you.

## 2021-03-25 NOTE — Telephone Encounter (Signed)
You have them now!

## 2021-03-31 NOTE — Telephone Encounter (Signed)
Colonoscopy report reviewed October 17, 2015 at [place], [place] diminutive ascending colon polyp s/p polypectomy. Bx- SSA.  Recommend repeat in 5 years.  Plan: -Proceed with repeat colonoscopy  RG

## 2021-04-01 NOTE — Telephone Encounter (Signed)
Scheduled with wife for 8-10 for office visit and 9-29 for a double as of now since she said he was asking for EGD and colon and I told her that once he comes in we see about doing both or just a colon

## 2021-05-13 ENCOUNTER — Ambulatory Visit: Payer: 59 | Admitting: Gastroenterology

## 2021-07-02 ENCOUNTER — Encounter: Payer: 59 | Admitting: Gastroenterology

## 2021-07-06 ENCOUNTER — Encounter: Payer: Self-pay | Admitting: Gastroenterology

## 2021-07-06 ENCOUNTER — Other Ambulatory Visit: Payer: Self-pay

## 2021-07-06 ENCOUNTER — Ambulatory Visit (INDEPENDENT_AMBULATORY_CARE_PROVIDER_SITE_OTHER): Payer: 59 | Admitting: Gastroenterology

## 2021-07-06 VITALS — BP 122/82 | HR 85 | Ht 70.0 in | Wt 147.5 lb

## 2021-07-06 DIAGNOSIS — R197 Diarrhea, unspecified: Secondary | ICD-10-CM

## 2021-07-06 DIAGNOSIS — K219 Gastro-esophageal reflux disease without esophagitis: Secondary | ICD-10-CM | POA: Diagnosis not present

## 2021-07-06 DIAGNOSIS — Z86010 Personal history of colonic polyps: Secondary | ICD-10-CM

## 2021-07-06 NOTE — Patient Instructions (Addendum)
If you are age 60 or older, your body mass index should be between 23-30. Your Body mass index is 21.16 kg/m. If this is out of the aforementioned range listed, please consider follow up with your Primary Care Provider.  If you are age 31 or younger, your body mass index should be between 19-25. Your Body mass index is 21.16 kg/m. If this is out of the aformentioned range listed, please consider follow up with your Primary Care Provider.   __________________________________________________________  The [facility] providers would like to encourage you to use [facility] to communicate with providers for non-urgent requests or questions.  Due to long hold times on the telephone, sending your provider a message by [facility] may be a faster and more efficient way to get a response.  Please allow 48 business hours for a response.  Please remember that this is for non-urgent requests.   You have been scheduled for an endoscopy and colonoscopy. Please follow the written instructions given to you at your visit today. Please pick up your prep supplies at the pharmacy within the next 1-3 days. If you use inhalers (even only as needed), please bring them with you on the day of your procedure.  Please go to the lab on the 2nd floor [address] before you leave the office today.   You can decrease omeprazole 40 to every other day. You have been advised as what to do when it comes to your vitamins.  Please call with any questions or concerns.  Thank you,  Dr. Jackquline Denmark

## 2021-07-06 NOTE — Progress Notes (Signed)
Chief Complaint:   Referring Provider:  Serita Grammes, MD      ASSESSMENT AND PLAN;   #1. GERD  #2. Diarrhea. ?Etiology.  #3. H/O polyps  Plan -Stool studies for GI Pathogen (includes C. Diff) elastace and Calprotectin. -EGD/colon with miralax. -Decrease omeprazole 40 QOD -Non pharmacological means of reflux control -Stop Vit x 2 weeks (has magnesium supplements may cause diarrhea).  Certainly, can give a trial of lactose-free diet if still with problems. -Stop smoking.   I discussed EGD/Colonoscopy- the indications, risks, alternatives and potential complications including, but not limited to, bleeding, infection, reaction to medication, damage to internal organs, cardiac and/or pulmonary problems, and perforation requiring surgery.The possibility that significant findings could be missed was explained. All ? were answered. The patient gives consent to proceed.   HPI:    Bruce Douglas is a 60 y.o. male  With H/O RCC s/p L nephrectomy, anxiety/asthma/GERD, HTN, hypothyroidism, CKD3(only R remaining kidney), on upper dentures  Underwent robotic left radical nephrectomy 06/2020 with postop course complicated by PSBO managed conservatively with NG tube, IV fluids.  C/O GERD x 40 yrs on omeprazole (x 20 years).  May have had remote EGD on 20 years ago.  No odynophagia or dysphagia.  Also wants to wean himself off omeprazole.  He is currently taking 40 mg once a day at night.  No nonsteroidals.  Does drink significant coffee.   No N/V.   C/O Diarrhea (after eating) 3-4/day x 1 year, ever since discharge from hospital 06/2020.  No melena or hematochezia.  He is due for repeat colonoscopy.  Wt Readings from Last 3 Encounters:  07/06/21 147 lb 8 oz (66.9 kg)  07/02/20 130 lb 6.4 oz (59.1 kg)  06/20/20 149 lb (67.6 kg)   History of alcohol abuse and cocaine use.  Last time 06/2020.  None since.    Previous GI work-up:  Colonoscopy January 2017 at [place]: Small  ascending colon polyp s/p polypectomy, biopsies SSA.  Recommend repeat in 5 years.  CT Abdo/pelvis 06/25/2020 -Distal partial or developing complete small bowel obstruction. -Interval left nephrectomy. Extensive body wall subcutaneous gas and small free intraperitoneal gas is likely postsurgical in nature. -Aortic Atherosclerosis (ICD10-I70.0).  MRI Abdo 04/2020 1. Findings are consistent with enlarging L renal cell carcinoma. 2. No evidence of metastatic disease in the abdomen. No evidence of renal vein invasion or lymphadenopathy. Past Medical History:  Diagnosis Date   Anxiety    Arthritis    hands   Asthma 06/04/2020   Cancer ([place])    Depression    GERD (gastroesophageal reflux disease)    History of kidney stones    Hypertension    Hypothyroidism     Past Surgical History:  Procedure Laterality Date   HEMORRHOID SURGERY  1991   ROBOT ASSISTED LAPAROSCOPIC NEPHRECTOMY Left 06/20/2020   Procedure: XI ROBOTIC ASSISTED LAPAROSCOPIC RADICAL NEPHRECTOMY;  Surgeon: Ceasar Mons, MD;  Location: [facility];  Service: Urology;  Laterality: Left;    Family History  Problem Relation Age of Onset   Ovarian cancer Mother    Prostate cancer Father    Colon cancer Neg Hx    Rectal cancer Neg Hx     Social History   Tobacco Use   Smoking status: Some Days    Packs/day: 0.25    Years: 15.00    Pack years: 3.75    Types: Cigarettes   Smokeless tobacco: Never  Vaping Use   Vaping Use: Never used  Substance  Use Topics   Alcohol use: Not Currently   Drug use: Not Currently    Comment: recovered drug and ETOH    Current Outpatient Medications  Medication Sig Dispense Refill   acetaminophen (TYLENOL) 500 MG tablet Take 1,000 mg by mouth in the morning and at bedtime. Take with celecoxib     albuterol (VENTOLIN HFA) 108 (90 Base) MCG/ACT inhaler Inhale 2 puffs into the lungs every 4 (four) hours as needed.     amLODipine (NORVASC) 5 MG tablet Take 5 mg by mouth at  bedtime.      atenolol (TENORMIN) 25 MG tablet Take 25 mg by mouth at bedtime.      buPROPion (WELLBUTRIN XL) 300 MG 24 hr tablet Take 300 mg by mouth at bedtime.      cyclobenzaprine (FLEXERIL) 10 MG tablet Take 10 mg by mouth See admin instructions. Take 1 tablet (10 mg) by mouth scheduled at bedtime, may take an additional dose during the day if needed for back exertion.     levothyroxine (SYNTHROID) 137 MCG tablet Take 137 mcg by mouth at bedtime.      omeprazole (PRILOSEC) 20 MG capsule Take 40 mg by mouth at bedtime.      QUEtiapine (SEROQUEL) 50 MG tablet Take 50 mg by mouth at bedtime.      sodium bicarbonate 650 MG tablet Take 650 mg by mouth daily.     tamsulosin (FLOMAX) 0.4 MG CAPS capsule Take 0.4 mg by mouth daily.     No current facility-administered medications for this visit.    No Known Allergies  Review of Systems:  Constitutional: Denies fever, chills, diaphoresis, appetite change and fatigue.  HEENT: Denies photophobia, eye pain, redness, hearing loss, ear pain, congestion, sore throat, rhinorrhea, sneezing, mouth sores, neck pain, neck stiffness and tinnitus.   Respiratory: Denies SOB, DOE, cough, chest tightness,  and wheezing.   Cardiovascular: Denies chest pain, palpitations and leg swelling.  Genitourinary: Denies dysuria, urgency, frequency, hematuria, flank pain and difficulty urinating.  Musculoskeletal: Denies myalgias, back pain, joint swelling, arthralgias and gait problem.  Skin: No rash.  Neurological: Denies dizziness, seizures, syncope, weakness, light-headedness, numbness and headaches.  Hematological: Denies adenopathy. Easy bruising, personal or family bleeding history  Psychiatric/Behavioral: Has anxiety/ depression     Physical Exam:    BP 122/82   Pulse 85   Ht 5\' 10"  (1.778 m)   Wt 147 lb 8 oz (66.9 kg)   SpO2 98%   BMI 21.16 kg/m  Wt Readings from Last 3 Encounters:  07/06/21 147 lb 8 oz (66.9 kg)  07/02/20 130 lb 6.4 oz (59.1 kg)   06/20/20 149 lb (67.6 kg)   Constitutional: Thin built, in no acute distress. Psychiatric: Normal mood and affect. Behavior is normal. HEENT: Pupils normal.  Conjunctivae are normal. No scleral icterus.  Cardiovascular: Normal rate, regular rhythm. No edema Pulmonary/chest: Effort normal and breath sounds-decreased.  No wheezing, rales or rhonchi. Abdominal: Soft, nondistended. Nontender. Bowel sounds active throughout. There are no masses palpable. No hepatomegaly. Rectal: Deferred Neurological: Alert and oriented to person place and time. Skin: Skin is warm and dry. No rashes noted.  Data Reviewed: I have personally reviewed following labs and imaging studies  CBC: CBC Latest Ref Rng & Units 07/02/2020 06/26/2020 06/25/2020  WBC 4.0 - 10.5 K/uL 11.9(H) - 4.5  Hemoglobin 13.0 - 17.0 g/dL 13.3 12.4(L) 13.4  Hematocrit 39.0 - 52.0 % 38.9(L) 37.1(L) 37.2(L)  Platelets 150 - 400 K/uL 390 - 271  CMP: CMP Latest Ref Rng & Units 07/03/2020 07/02/2020 07/01/2020  Glucose 70 - 99 mg/dL 103(H) 105(H) 112(H)  BUN 6 - 20 mg/dL 15 13 19   Creatinine 0.61 - 1.24 mg/dL 1.44(H) 1.33(H) 1.39(H)  Sodium 135 - 145 mmol/L 133(L) 134(L) 130(L)  Potassium 3.5 - 5.1 mmol/L 4.1 4.4 4.1  Chloride 98 - 111 mmol/L 99 101 99  CO2 22 - 32 mmol/L 23 25 22   Calcium 8.9 - 10.3 mg/dL 9.1 8.9 8.8(L)  Total Protein 6.5 - 8.1 g/dL - 6.5 6.7  Total Bilirubin 0.3 - 1.2 mg/dL - 0.2(L) 0.4  Alkaline Phos 38 - 126 U/L - 49 56  AST 15 - 41 U/L - 17 22  ALT 0 - 44 U/L - 21 24      Carmell Austria, MD 07/06/2021, 2:49 PM  Cc: Serita Grammes, MD

## 2021-07-07 ENCOUNTER — Encounter: Payer: Self-pay | Admitting: Gastroenterology

## 2021-07-17 ENCOUNTER — Encounter: Payer: Self-pay | Admitting: Gastroenterology

## 2021-07-17 ENCOUNTER — Other Ambulatory Visit: Payer: Self-pay

## 2021-07-17 ENCOUNTER — Ambulatory Visit (AMBULATORY_SURGERY_CENTER): Payer: 59 | Admitting: Gastroenterology

## 2021-07-17 VITALS — BP 114/76 | HR 66 | Temp 97.8°F | Resp 21 | Ht 70.0 in | Wt 147.0 lb

## 2021-07-17 DIAGNOSIS — R197 Diarrhea, unspecified: Secondary | ICD-10-CM

## 2021-07-17 DIAGNOSIS — K640 First degree hemorrhoids: Secondary | ICD-10-CM | POA: Diagnosis not present

## 2021-07-17 DIAGNOSIS — K208 Other esophagitis without bleeding: Secondary | ICD-10-CM | POA: Diagnosis not present

## 2021-07-17 DIAGNOSIS — K2970 Gastritis, unspecified, without bleeding: Secondary | ICD-10-CM

## 2021-07-17 DIAGNOSIS — K219 Gastro-esophageal reflux disease without esophagitis: Secondary | ICD-10-CM | POA: Diagnosis not present

## 2021-07-17 DIAGNOSIS — K5730 Diverticulosis of large intestine without perforation or abscess without bleeding: Secondary | ICD-10-CM | POA: Diagnosis not present

## 2021-07-17 DIAGNOSIS — K449 Diaphragmatic hernia without obstruction or gangrene: Secondary | ICD-10-CM

## 2021-07-17 DIAGNOSIS — Z86010 Personal history of colonic polyps: Secondary | ICD-10-CM

## 2021-07-17 HISTORY — PX: UPPER GASTROINTESTINAL ENDOSCOPY: SHX188

## 2021-07-17 HISTORY — PX: COLONOSCOPY: SHX174

## 2021-07-17 MED ORDER — FAMOTIDINE 40 MG PO TABS: 40.0000 mg | ORAL_TABLET | Freq: Two times a day (BID) | ORAL | 6 refills | Status: DC

## 2021-07-17 MED ORDER — SODIUM CHLORIDE 0.9 % IV SOLN: 500.0000 mL | INTRAVENOUS | Status: DC

## 2021-07-17 NOTE — Progress Notes (Signed)
Called to room to assist during endoscopic procedure.  Patient ID and intended procedure confirmed with present staff. Received instructions for my participation in the procedure from the performing physician.  

## 2021-07-17 NOTE — Op Note (Signed)
[place] Patient Name: Bruce Douglas Procedure Date: 07/17/2021 8:05 AM MRN: [id number] Endoscopist: Jackquline Denmark , MD Age: 60 Referring MD:  Date of Birth: 1960-12-11 Gender: Male Account #: [id number] Procedure:                Upper GI endoscopy Indications:              GERD. Epi pain. Medicines:                Monitored Anesthesia Care Procedure:                Pre-Anesthesia Assessment:                           - Prior to the procedure, a History and Physical                            was performed, and patient medications and                            allergies were reviewed. The patient's tolerance of                            previous anesthesia was also reviewed. The risks                            and benefits of the procedure and the sedation                            options and risks were discussed with the patient.                            All questions were answered, and informed consent                            was obtained. Prior Anticoagulants: The patient has                            taken no previous anticoagulant or antiplatelet                            agents. ASA Grade Assessment: II - A patient with                            mild systemic disease. After reviewing the risks                            and benefits, the patient was deemed in                            satisfactory condition to undergo the procedure.                           After obtaining informed consent, the endoscope was  passed under direct vision. Throughout the                            procedure, the patient's blood pressure, pulse, and                            oxygen saturations were monitored continuously. The                            Endoscope was introduced through the mouth, and                            advanced to the second part of duodenum. The upper                            GI endoscopy was accomplished without  difficulty.                            The patient tolerated the procedure well. Scope In: Scope Out: Findings:                 The Z-line was irregular and was found 36 cm from                            the incisors. Biopsies were taken with a cold                            forceps for histology, directed by [facility].                           A small hiatal hernia was present.                           The entire examined stomach was normal. Biopsies                            were taken with a cold forceps for histology.                           The examined duodenum was normal. Biopsies for                            histology were taken with a cold forceps for                            evaluation of celiac disease. Complications:            No immediate complications. Estimated Blood Loss:     Estimated blood loss: none. Impression:               - Z-line irregular, 36 cm from the incisors.                            Biopsied.                           -  Small hiatal hernia.                           - Normal stomach. Biopsied.                           - Normal examined duodenum. Biopsied. Recommendation:           - Patient has a contact number available for                            emergencies. The signs and symptoms of potential                            delayed complications were discussed with the                            patient. Return to normal activities tomorrow.                            Written discharge instructions were provided to the                            patient.                           - Resume previous diet.                           - Continue present medications.                           - Await pathology results.                           - The findings and recommendations were discussed                            with the patient's family. Jackquline Denmark, MD 07/17/2021 8:39:43 AM This report has been signed electronically.

## 2021-07-17 NOTE — Op Note (Signed)
[place] Patient Name: Bruce Douglas Procedure Date: 07/17/2021 7:59 AM MRN: [id number] Endoscopist: Jackquline Denmark , MD Age: 60 Referring MD:  Date of Birth: 1961/01/05 Gender: Male Account #: [id number] Procedure:                Colonoscopy Indications:              Clinically significant diarrhea of unexplained                            origin Medicines:                Monitored Anesthesia Care Procedure:                Pre-Anesthesia Assessment:                           - Prior to the procedure, a History and Physical                            was performed, and patient medications and                            allergies were reviewed. The patient's tolerance of                            previous anesthesia was also reviewed. The risks                            and benefits of the procedure and the sedation                            options and risks were discussed with the patient.                            All questions were answered, and informed consent                            was obtained. Prior Anticoagulants: The patient has                            taken no previous anticoagulant or antiplatelet                            agents. ASA Grade Assessment: II - A patient with                            mild systemic disease. After reviewing the risks                            and benefits, the patient was deemed in                            satisfactory condition to undergo the procedure.  After obtaining informed consent, the colonoscope                            was passed under direct vision. Throughout the                            procedure, the patient's blood pressure, pulse, and                            oxygen saturations were monitored continuously. The                            Colonoscope was introduced through the anus and                            advanced to the 2 cm into the ileum. The                             colonoscopy was performed without difficulty. The                            patient tolerated the procedure well. The quality                            of the bowel preparation was good. The terminal                            ileum, ileocecal valve, appendiceal orifice, and                            rectum were photographed. Scope In: 8:18:13 AM Scope Out: 8:32:03 AM Scope Withdrawal Time: 0 hours 9 minutes 54 seconds  Total Procedure Duration: 0 hours 13 minutes 50 seconds  Findings:                 The colon (entire examined portion) appeared                            normal. Biopsies were taken with a cold forceps for                            histology.                           A few rare small-mouthed diverticula were found in                            the sigmoid colon.                           Non-bleeding internal hemorrhoids were found during                            retroflexion. The hemorrhoids were small and Grade  I (internal hemorrhoids that do not prolapse).                           The terminal ileum appeared normal.                           The exam was otherwise without abnormality on                            direct and retroflexion views. Complications:            No immediate complications. Estimated Blood Loss:     Estimated blood loss: none. Impression:               - Minimal sigmoid diverticulosis.                           - Non-bleeding internal hemorrhoids.                           - The examined portion of the ileum was normal.                           - The examination was otherwise normal on direct                            and retroflexion views. Recommendation:           - Patient has a contact number available for                            emergencies. The signs and symptoms of potential                            delayed complications were discussed with the                            patient. Return to  normal activities tomorrow.                            Written discharge instructions were provided to the                            patient.                           - Resume previous diet.                           - Continue present medications.                           - Await pathology results.                           - Repeat colonoscopy in 10 years for screening  purposes. Earlier, if with any new problems or                            change in family history.                           - The findings and recommendations were discussed                            with the patient's family. Jackquline Denmark, MD 07/17/2021 8:43:13 AM This report has been signed electronically.

## 2021-07-17 NOTE — Progress Notes (Signed)
Pt in recovery with monitors in place, VSS. Report given to receiving RN. Bite guard was placed with pt awake to ensure comfort. No dental or soft tissue damage noted. 

## 2021-07-17 NOTE — Patient Instructions (Addendum)
Await pathology  Please read over handouts about diverticulosis, hemorrhoids, hiatal hernias  Please continue your normal medications- follow Dr. Steve Rattler instructions- Famotidine 40 mg every other day, alternate with Omeprazole 20 mg every other day  Next colonoscopy- 10 years; earlier if you have any issues or any change in family history   YOU HAD AN ENDOSCOPIC PROCEDURE TODAY AT [facility]:   Refer to the procedure report that was given to you for any specific questions about what was found during the examination.  If the procedure report does not answer your questions, please call your gastroenterologist to clarify.  If you requested that your care partner not be given the details of your procedure findings, then the procedure report has been included in a sealed envelope for you to review at your convenience later.  YOU SHOULD EXPECT: Some feelings of bloating in the abdomen. Passage of more gas than usual.  Walking can help get rid of the air that was put into your GI tract during the procedure and reduce the bloating. If you had a lower endoscopy (such as a colonoscopy or flexible sigmoidoscopy) you may notice spotting of blood in your stool or on the toilet paper. If you underwent a bowel prep for your procedure, you may not have a normal bowel movement for a few days.  Please Note:  You might notice some irritation and congestion in your nose or some drainage.  This is from the oxygen used during your procedure.  There is no need for concern and it should clear up in a day or so.  SYMPTOMS TO REPORT IMMEDIATELY:  Following lower endoscopy (colonoscopy or flexible sigmoidoscopy):  Excessive amounts of blood in the stool  Significant tenderness or worsening of abdominal pains  Swelling of the abdomen that is new, acute  Fever of 100F or higher  Following upper endoscopy (EGD)  Vomiting of blood or coffee ground material  New chest pain or pain under the shoulder  blades  Painful or persistently difficult swallowing  New shortness of breath  Fever of 100F or higher  Black, tarry-looking stools  For urgent or emergent issues, a gastroenterologist can be reached at any hour by calling [phone]. Do not use MyChart messaging for urgent concerns.    DIET:  We do recommend a small meal at first, but then you may proceed to your regular diet.  Drink plenty of fluids but you should avoid alcoholic beverages for 24 hours.  ACTIVITY:  You should plan to take it easy for the rest of today and you should NOT DRIVE or use heavy machinery until tomorrow (because of the sedation medicines used during the test).    FOLLOW UP: Our staff will call the number listed on your records 48-72 hours following your procedure to check on you and address any questions or concerns that you may have regarding the information given to you following your procedure. If we do not reach you, we will leave a message.  We will attempt to reach you two times.  During this call, we will ask if you have developed any symptoms of COVID 19. If you develop any symptoms (ie: fever, flu-like symptoms, shortness of breath, cough etc.) before then, please call [phone].  If you test positive for Covid 19 in the 2 weeks post procedure, please call and report this information to [place].    If any biopsies were taken you will be contacted by phone or by letter within the next 1-3  weeks.  Please call us at [phone] if you have not heard about the biopsies in 3 weeks.    SIGNATURES/CONFIDENTIALITY: You and/or your care partner have signed paperwork which will be entered into your electronic medical record.  These signatures attest to the fact that that the information above on your After Visit Summary has been reviewed and is understood.  Full responsibility of the confidentiality of this discharge information lies with you and/or your care-partner.

## 2021-07-17 NOTE — Progress Notes (Signed)
Chief Complaint:   Referring Provider:  Serita Grammes, MD      ASSESSMENT AND PLAN;   #1. GERD  #2. Diarrhea. ?Etiology.  #3. H/O polyps  Plan -Stool studies for GI Pathogen (includes C. Diff) elastace and Calprotectin. -EGD/colon with miralax. -Decrease omeprazole 40 QOD -Non pharmacological means of reflux control -Stop Vit x 2 weeks (has magnesium supplements may cause diarrhea).  Certainly, can give a trial of lactose-free diet if still with problems. -Stop smoking.   I discussed EGD/Colonoscopy- the indications, risks, alternatives and potential complications including, but not limited to, bleeding, infection, reaction to medication, damage to internal organs, cardiac and/or pulmonary problems, and perforation requiring surgery.The possibility that significant findings could be missed was explained. All ? were answered. The patient gives consent to proceed.   HPI:    Bruce Douglas is a 60 y.o. male  With H/O RCC s/p L nephrectomy, anxiety/asthma/GERD, HTN, hypothyroidism, CKD3(only R remaining kidney), on upper dentures  Underwent robotic left radical nephrectomy 06/2020 with postop course complicated by PSBO managed conservatively with NG tube, IV fluids.  C/O GERD x 40 yrs on omeprazole (x 20 years).  May have had remote EGD on 20 years ago.  No odynophagia or dysphagia.  Also wants to wean himself off omeprazole.  He is currently taking 40 mg once a day at night.  No nonsteroidals.  Does drink significant coffee.   No N/V.   C/O Diarrhea (after eating) 3-4/day x 1 year, ever since discharge from hospital 06/2020.  No melena or hematochezia.  He is due for repeat colonoscopy.  Wt Readings from Last 3 Encounters:  07/17/21 147 lb (66.7 kg)  07/06/21 147 lb 8 oz (66.9 kg)  07/02/20 130 lb 6.4 oz (59.1 kg)   History of alcohol abuse and cocaine use.  Last time 06/2020.  None since.    Previous GI work-up:  Colonoscopy January 2017 at [facility]: Small  ascending colon polyp s/p polypectomy, biopsies SSA.  Recommend repeat in 5 years.  CT Abdo/pelvis 06/25/2020 -Distal partial or developing complete small bowel obstruction. -Interval left nephrectomy. Extensive body wall subcutaneous gas and small free intraperitoneal gas is likely postsurgical in nature. -Aortic Atherosclerosis (ICD10-I70.0).  MRI Abdo 04/2020 1. Findings are consistent with enlarging L renal cell carcinoma. 2. No evidence of metastatic disease in the abdomen. No evidence of renal vein invasion or lymphadenopathy. Past Medical History:  Diagnosis Date   Anxiety    Arthritis    hands   Asthma 06/04/2020   Cancer ([place])    Chronic kidney disease    Depression    GERD (gastroesophageal reflux disease)    History of kidney stones    Hypertension    Hypothyroidism     Past Surgical History:  Procedure Laterality Date   HEMORRHOID SURGERY  1991   ROBOT ASSISTED LAPAROSCOPIC NEPHRECTOMY Left 06/20/2020   Procedure: XI ROBOTIC ASSISTED LAPAROSCOPIC RADICAL NEPHRECTOMY;  Surgeon: Ceasar Mons, MD;  Location: [facility];  Service: Urology;  Laterality: Left;    Family History  Problem Relation Age of Onset   Ovarian cancer Mother    Prostate cancer Father    Colon cancer Neg Hx    Rectal cancer Neg Hx     Social History   Tobacco Use   Smoking status: Former    Packs/day: 0.25    Years: 15.00    Pack years: 3.75    Types: Cigarettes   Smokeless tobacco: Never  Vaping Use   Vaping  Use: Never used  Substance Use Topics   Alcohol use: Not Currently   Drug use: Not Currently    Comment: recovered drug and ETOH    Current Outpatient Medications  Medication Sig Dispense Refill   acetaminophen (TYLENOL) 500 MG tablet Take 1,000 mg by mouth in the morning and at bedtime. Take with celecoxib     amLODipine (NORVASC) 5 MG tablet Take 5 mg by mouth at bedtime.      amoxicillin (AMOXIL) 875 MG tablet Take 875 mg by mouth 2 (two) times daily.      atenolol (TENORMIN) 25 MG tablet Take 25 mg by mouth at bedtime.      buPROPion (WELLBUTRIN XL) 300 MG 24 hr tablet Take 300 mg by mouth at bedtime.      cyclobenzaprine (FLEXERIL) 10 MG tablet Take 10 mg by mouth See admin instructions. Take 1 tablet (10 mg) by mouth scheduled at bedtime, may take an additional dose during the day if needed for back exertion.     levothyroxine (SYNTHROID) 137 MCG tablet Take 137 mcg by mouth at bedtime.      omeprazole (PRILOSEC) 20 MG capsule Take 40 mg by mouth at bedtime.      QUEtiapine (SEROQUEL) 50 MG tablet Take 50 mg by mouth at bedtime.      sodium bicarbonate 650 MG tablet Take 650 mg by mouth daily.     tamsulosin (FLOMAX) 0.4 MG CAPS capsule Take 0.4 mg by mouth daily.     albuterol (VENTOLIN HFA) 108 (90 Base) MCG/ACT inhaler Inhale 2 puffs into the lungs every 4 (four) hours as needed.     Current Facility-Administered Medications  Medication Dose Route Frequency Provider Last Rate Last Admin   0.9 %  sodium chloride infusion  500 mL Intravenous Continuous Jackquline Denmark, MD        No Known Allergies  Review of Systems:  Constitutional: Denies fever, chills, diaphoresis, appetite change and fatigue.  HEENT: Denies photophobia, eye pain, redness, hearing loss, ear pain, congestion, sore throat, rhinorrhea, sneezing, mouth sores, neck pain, neck stiffness and tinnitus.   Respiratory: Denies SOB, DOE, cough, chest tightness,  and wheezing.   Cardiovascular: Denies chest pain, palpitations and leg swelling.  Genitourinary: Denies dysuria, urgency, frequency, hematuria, flank pain and difficulty urinating.  Musculoskeletal: Denies myalgias, back pain, joint swelling, arthralgias and gait problem.  Skin: No rash.  Neurological: Denies dizziness, seizures, syncope, weakness, light-headedness, numbness and headaches.  Hematological: Denies adenopathy. Easy bruising, personal or family bleeding history  Psychiatric/Behavioral: Has anxiety/  depression     Physical Exam:    BP 135/80   Pulse 68   Temp 97.8 F (36.6 C) (Temporal)   Ht 5\' 10"  (1.778 m)   Wt 147 lb (66.7 kg)   SpO2 100%   BMI 21.09 kg/m  Wt Readings from Last 3 Encounters:  07/17/21 147 lb (66.7 kg)  07/06/21 147 lb 8 oz (66.9 kg)  07/02/20 130 lb 6.4 oz (59.1 kg)   Constitutional: Thin built, in no acute distress. Psychiatric: Normal mood and affect. Behavior is normal. HEENT: Pupils normal.  Conjunctivae are normal. No scleral icterus.  Cardiovascular: Normal rate, regular rhythm. No edema Pulmonary/chest: Effort normal and breath sounds-decreased.  No wheezing, rales or rhonchi. Abdominal: Soft, nondistended. Nontender. Bowel sounds active throughout. There are no masses palpable. No hepatomegaly. Rectal: Deferred Neurological: Alert and oriented to person place and time. Skin: Skin is warm and dry. No rashes noted.  Data Reviewed: I have personally  reviewed following labs and imaging studies  CBC: CBC Latest Ref Rng & Units 07/02/2020 06/26/2020 06/25/2020  WBC 4.0 - 10.5 K/uL 11.9(H) - 4.5  Hemoglobin 13.0 - 17.0 g/dL 13.3 12.4(L) 13.4  Hematocrit 39.0 - 52.0 % 38.9(L) 37.1(L) 37.2(L)  Platelets 150 - 400 K/uL 390 - 271    CMP: CMP Latest Ref Rng & Units 07/03/2020 07/02/2020 07/01/2020  Glucose 70 - 99 mg/dL 103(H) 105(H) 112(H)  BUN 6 - 20 mg/dL 15 13 19   Creatinine 0.61 - 1.24 mg/dL 1.44(H) 1.33(H) 1.39(H)  Sodium 135 - 145 mmol/L 133(L) 134(L) 130(L)  Potassium 3.5 - 5.1 mmol/L 4.1 4.4 4.1  Chloride 98 - 111 mmol/L 99 101 99  CO2 22 - 32 mmol/L 23 25 22   Calcium 8.9 - 10.3 mg/dL 9.1 8.9 8.8(L)  Total Protein 6.5 - 8.1 g/dL - 6.5 6.7  Total Bilirubin 0.3 - 1.2 mg/dL - 0.2(L) 0.4  Alkaline Phos 38 - 126 U/L - 49 56  AST 15 - 41 U/L - 17 22  ALT 0 - 44 U/L - 21 24      Carmell Austria, MD 07/17/2021, 8:03 AM  Cc: Serita Grammes, MD

## 2021-07-17 NOTE — Progress Notes (Signed)
Per Dr. Lyndel Safe, Famotidine 40 mg po qod #6 (send into pharmacy).  Pt to alternate Famotide 40 mg qod, with Omeprazole 20 mg qod  905- upon sitting on side of bed to get dressed, pt states "my stomach hurts."  Abdomen soft.  Pt passed air- I asked him if it made him feel better and he states it does.  Pt encouraged to ambulate and drink warm fluids if he has abdominal pain.  Understanding voiced

## 2021-07-21 ENCOUNTER — Telehealth: Payer: Self-pay

## 2021-07-21 ENCOUNTER — Telehealth: Payer: Self-pay | Admitting: *Deleted

## 2021-07-21 NOTE — Telephone Encounter (Signed)
NO ANSWER, MESSAGE LEFT FOR PATIENT. 

## 2021-07-21 NOTE — Telephone Encounter (Signed)
  Follow up Call-  Call back number 07/17/2021  Post procedure Call Back phone  # [phone]  Permission to leave phone message Yes     Patient questions:  Do you have a fever, pain , or abdominal swelling? No. Pain Score  0 *  Have you tolerated food without any problems? Yes.    Have you been able to return to your normal activities? Yes.    Do you have any questions about your discharge instructions: Diet   No. Medications  No. Follow up visit  No.  Do you have questions or concerns about your Care? No.  Actions: * If pain score is 4 or above: No action needed, pain <4.  Have you developed a fever since your procedure? no  2.   Have you had an respiratory symptoms (SOB or cough) since your procedure? no  3.   Have you tested positive for COVID 19 since your procedure no  4.   Have you had any family members/close contacts diagnosed with the COVID 19 since your procedure?  no   If yes to any of these questions please route to Joylene John, RN and Joella Prince, RN

## 2021-07-28 ENCOUNTER — Encounter: Payer: Self-pay | Admitting: Gastroenterology

## 2021-08-04 ENCOUNTER — Other Ambulatory Visit: Payer: 59

## 2021-08-04 DIAGNOSIS — R197 Diarrhea, unspecified: Secondary | ICD-10-CM

## 2021-08-04 DIAGNOSIS — Z86010 Personal history of colonic polyps: Secondary | ICD-10-CM

## 2021-08-04 DIAGNOSIS — K219 Gastro-esophageal reflux disease without esophagitis: Secondary | ICD-10-CM

## 2021-08-06 LAB — GI PROFILE, STOOL, PCR

## 2021-08-06 LAB — CALPROTECTIN, FECAL: Calprotectin, Fecal: 58 ug/g (ref 0–120)

## 2021-08-13 LAB — CLOSTRIDIUM DIFFICILE TOXIN B, QUALITATIVE, REAL-TIME PCR: Toxigenic C. Difficile by PCR: NOT DETECTED

## 2021-08-13 LAB — PANCREATIC ELASTASE, FECAL: Pancreatic Elastase-1, Stool: >500 mcg/g

## 2022-01-14 ENCOUNTER — Other Ambulatory Visit: Payer: Self-pay | Admitting: Gastroenterology

## 2022-06-04 IMAGING — MR MR ABDOMEN WO/W CM
18 series · 48 of 48 positions shown · IV contrast (14ml multihance)
Comparison: MR abdomen, 10/17/2019, 11/07/2018

CLINICAL DATA: Follow-up complex left renal cyst

EXAM:
MRI ABDOMEN WITHOUT AND WITH CONTRAST
TECHNIQUE: Multiplanar multisequence MR imaging of the abdomen was performed
both before and after the administration of intravenous contrast.
CONTRAST:  14mL MULTIHANCE GADOBENATE DIMEGLUMINE 529 MG/ML IV SOLN

[Series 3: T2 · coronal · 5.0mm · 1.56mm/px · 1 of 36 slices shown (1 of 3)]
[im 1/36]
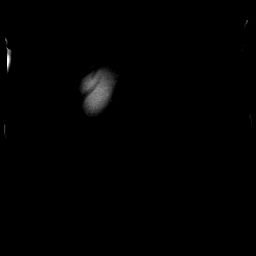

[Series 4: T1 · axial · 3.0mm · 1.09mm/px · z∈[-158,+31]mm · 4 of 128 slices shown]
[im 1/128]
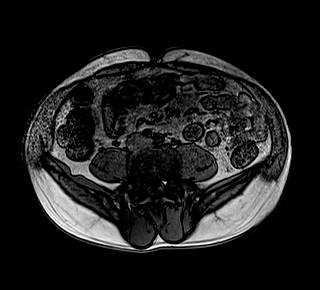
[im 43/128]
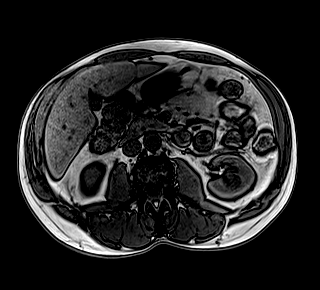
[im 85/128]
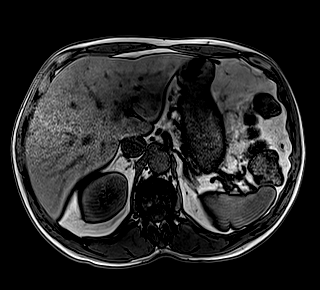
[im 128/128]
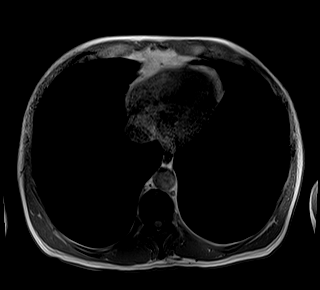

[Series 5: T2 · axial · 5.0mm · 1.48mm/px · z∈[-174,+60]mm · 2 of 40 slices shown (2 of 3)]
[im 1/40]
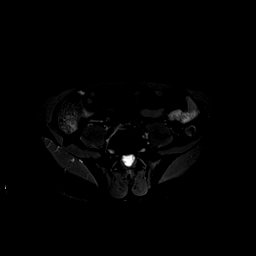
[im 40/40]
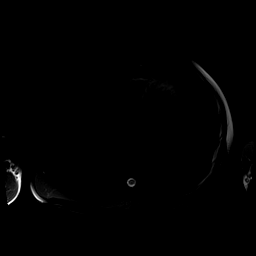

[Series 6: DWI · 1 of 11 slices shown (1 of 3)]
[im 1/11]
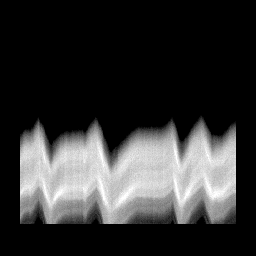

[Series 7: DWI · axial · 5.0mm · 1.42mm/px · z∈[-150,+66]mm · 5 of 111 slices shown (2 of 3)]
[im 1/111]
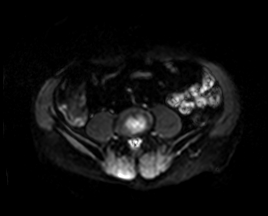
[im 28/111]
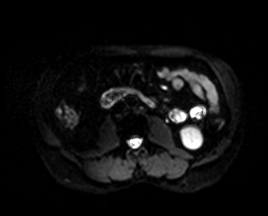
[im 56/111]
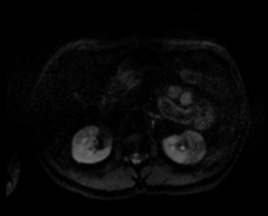
[im 83/111]
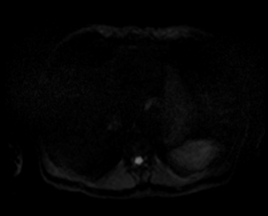
[im 111/111]
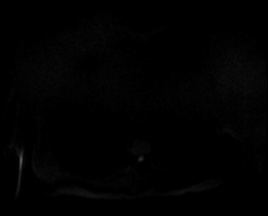

[Series 8: DWI · axial · 5.0mm · 1.42mm/px · z∈[-150,+66]mm · 2 of 37 slices shown (3 of 3)]
[im 1/37]
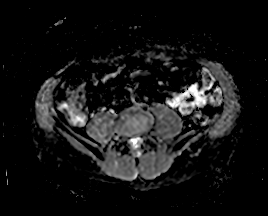
[im 37/37]
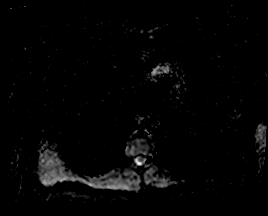

[Series 9: T2 · axial · 6.0mm · 1.09mm/px · 1 of 30 slices shown (3 of 3)]
[im 1/30]
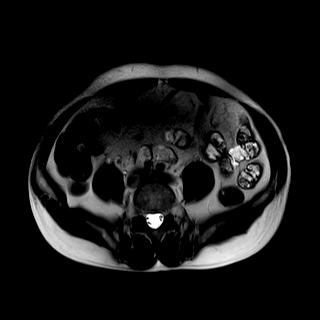

[Series 10: bSSFP · axial · 5.0mm · 1.25mm/px · z∈[-174,+48]mm · 2 of 38 slices shown]
[im 1/38]
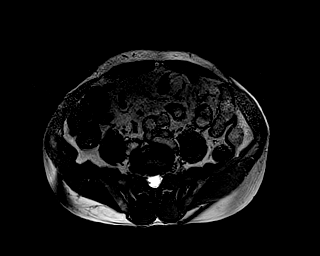
[im 38/38]
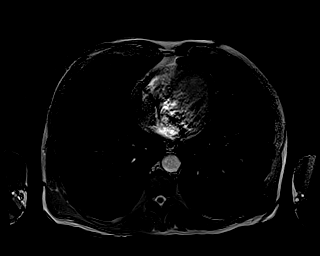

[Series 11: T1 dynamic · axial · non-contrast · 3.0mm · 1.25mm/px · z∈[-170,+43]mm · 3 of 72 slices shown]
[im 1/72]
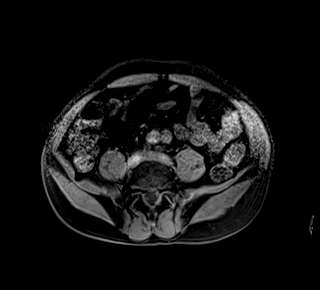
[im 36/72]
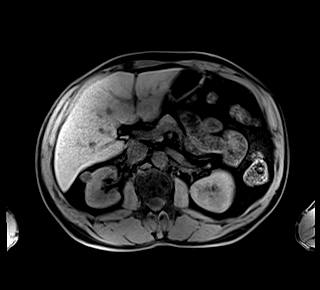
[im 72/72]
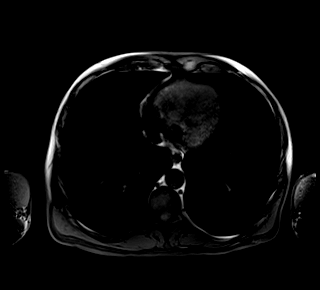

[Series 12: T1 dynamic post-contrast · axial · 3.0mm · 1.25mm/px · z∈[-170,+43]mm · 3 of 72 slices shown (1 of 9)]
[im 1/72]
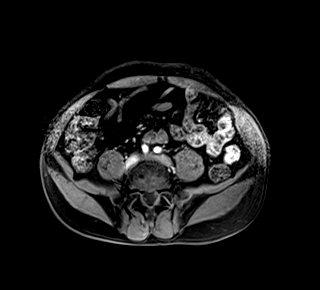
[im 36/72]
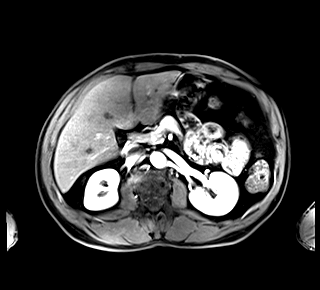
[im 72/72]
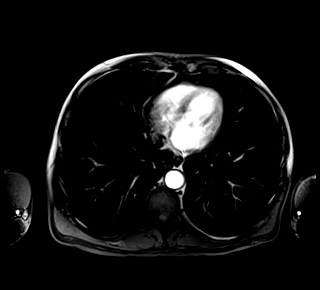

[Series 13: T1 dynamic post-contrast · axial · 3.0mm · 1.25mm/px · z∈[-170,+43]mm · 3 of 72 slices shown (2 of 9)]
[im 1/72]
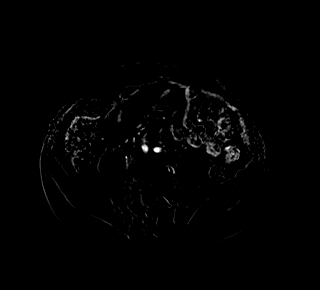
[im 36/72]
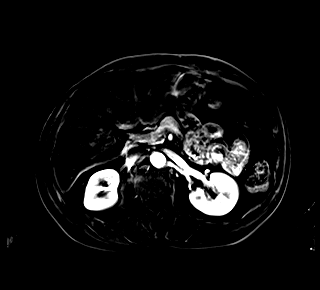
[im 72/72]
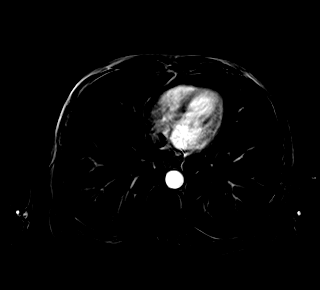

[Series 14: T1 dynamic post-contrast · axial · 3.0mm · 1.25mm/px · z∈[-170,+43]mm · 3 of 72 slices shown (3 of 9)]
[im 1/72]
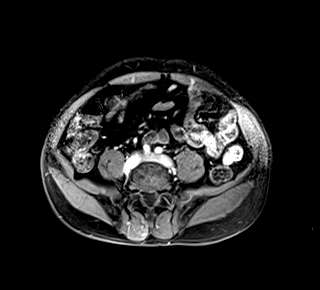
[im 36/72]
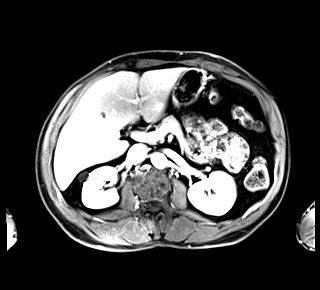
[im 72/72]
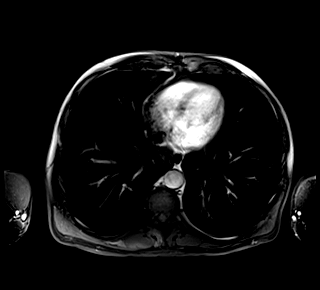

[Series 15: T1 dynamic post-contrast · axial · 3.0mm · 1.25mm/px · z∈[-170,+43]mm · 3 of 72 slices shown (4 of 9)]
[im 1/72]
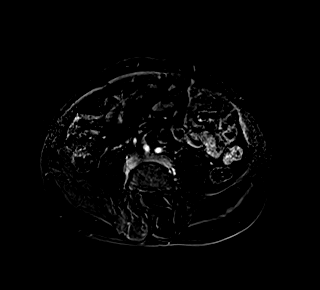
[im 36/72]
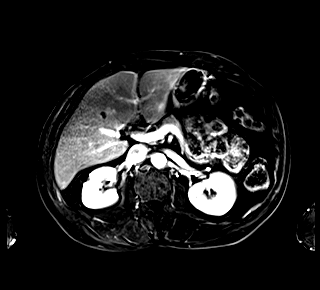
[im 72/72]
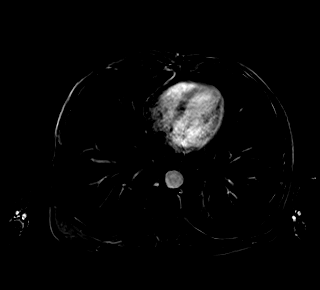

[Series 16: T1 dynamic post-contrast · axial · 3.0mm · 1.25mm/px · z∈[-170,+43]mm · 3 of 72 slices shown (5 of 9)]
[im 1/72]
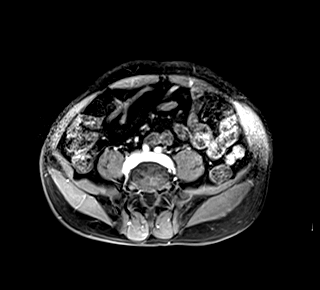
[im 36/72]
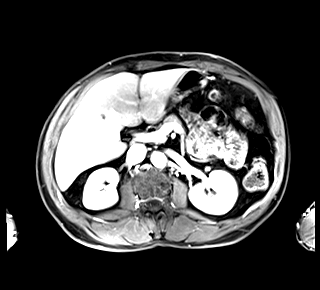
[im 72/72]
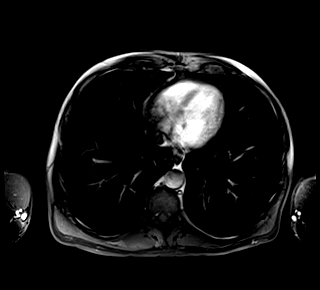

[Series 17: T1 dynamic post-contrast · axial · 3.0mm · 1.25mm/px · z∈[-170,+43]mm · 3 of 72 slices shown (6 of 9)]
[im 1/72]
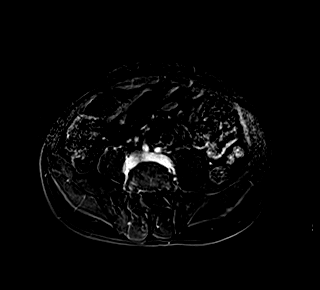
[im 36/72]
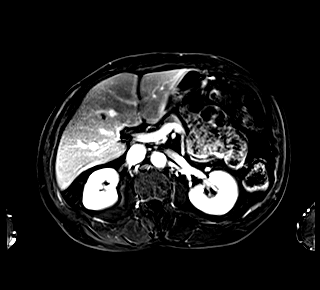
[im 72/72]
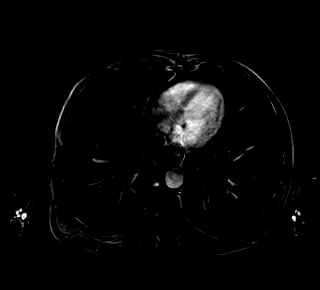

[Series 18: T1 dynamic post-contrast · coronal · 3.0mm · 1.25mm/px · 3 of 72 slices shown (7 of 9)]
[im 1/72]
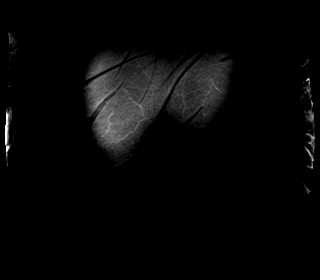
[im 36/72]
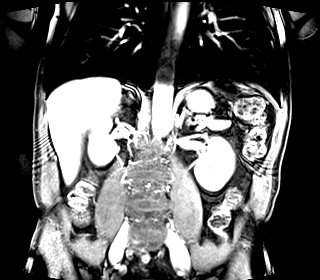
[im 72/72]
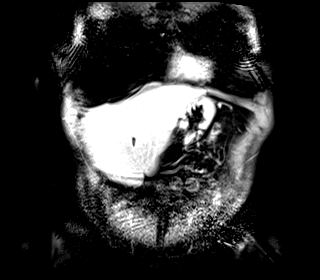

[Series 19: T1 dynamic post-contrast · axial · 3.0mm · 1.25mm/px · z∈[-170,+43]mm · 3 of 72 slices shown (8 of 9)]
[im 1/72]
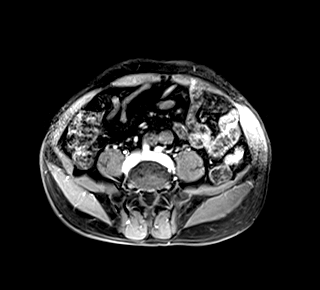
[im 36/72]
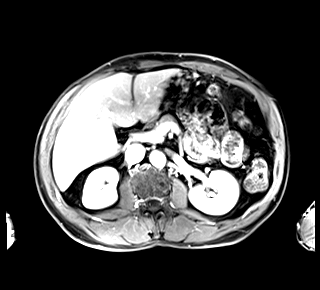
[im 72/72]
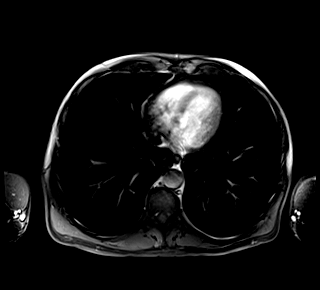

[Series 20: T1 dynamic post-contrast · axial · 3.0mm · 1.25mm/px · z∈[-170,+43]mm · 3 of 72 slices shown (9 of 9)]
[im 1/72]
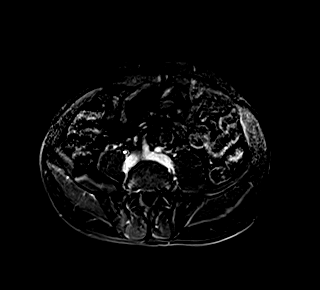
[im 36/72]
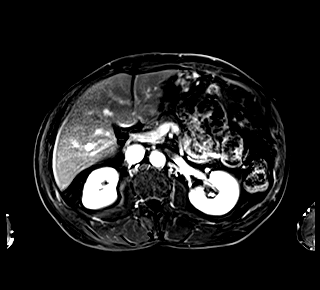
[im 72/72]
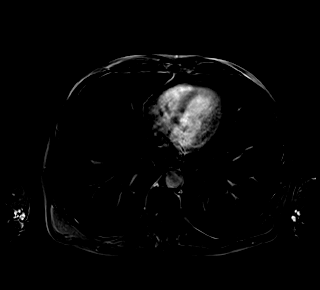

[48 of 48 positions shown; findings below may reference images not displayed]

FINDINGS: Lower chest: No acute findings.

Hepatobiliary: No mass or other parenchymal abnormality identified.

Pancreas: No mass, inflammatory changes, or other parenchymal
abnormality identified.

Spleen:  Within normal limits in size and appearance.

Adrenals/Urinary Tract: There is a redemonstrated complex solid and
cystic lesion of the inferior pole of the left kidney. Internal
contrast enhancing nodular components have significantly increased
in size compared to prior examination dated 10/17/2019, now
measuring in aggregate approximately 1.7 x 1.1 cm (series 12, image
44). Overall lesion dimensions are approximately 2.1 x 1.8 cm, not
significantly changed compared to prior examination when measured
similarly (series 9, image 19). No evidence of hydronephrosis.

Stomach/Bowel: Visualized portions within the abdomen are
unremarkable.

Vascular/Lymphatic: No pathologically enlarged lymph nodes
identified. No abdominal aortic aneurysm demonstrated.

Other:  None.

Musculoskeletal: No suspicious bone lesions identified.
IMPRESSION: 1. There is a redemonstrated complex solid and cystic lesion of the
inferior pole of the left kidney. Internal contrast enhancing
nodular components have significantly increased in size compared to
prior examination when measured similarly, now measuring in
aggregate approximately 1.7 x 1.1 cm. Findings are consistent with
enlarging renal cell carcinoma.
2. No evidence of metastatic disease in the abdomen. No evidence of
renal vein invasion or lymphadenopathy.

## 2023-02-21 ENCOUNTER — Other Ambulatory Visit: Payer: Self-pay | Admitting: Urology

## 2023-02-21 DIAGNOSIS — C642 Malignant neoplasm of left kidney, except renal pelvis: Secondary | ICD-10-CM

## 2023-04-03 ENCOUNTER — Other Ambulatory Visit: Payer: Self-pay | Admitting: Gastroenterology

## 2023-07-04 ENCOUNTER — Other Ambulatory Visit: Payer: Self-pay | Admitting: Gastroenterology

## 2023-09-29 ENCOUNTER — Telehealth: Payer: Self-pay | Admitting: Gastroenterology

## 2023-09-29 MED ORDER — OMEPRAZOLE 40 MG PO CPDR: 40.0000 mg | DELAYED_RELEASE_CAPSULE | Freq: Every day | ORAL | 0 refills | Status: DC

## 2023-09-29 MED ORDER — FAMOTIDINE 40 MG PO TABS: 40.0000 mg | ORAL_TABLET | Freq: Two times a day (BID) | ORAL | 0 refills | Status: DC

## 2023-09-29 NOTE — Telephone Encounter (Signed)
Patient's wife called requesting a refill on Pepcid.

## 2023-09-29 NOTE — Telephone Encounter (Signed)
Medication sent to pharmacy and appointment made. Patient alternates with the omeprazole at night with Pepcid and does pepcid always in the morning

## 2023-09-30 ENCOUNTER — Other Ambulatory Visit: Payer: Self-pay | Admitting: Gastroenterology

## 2023-12-06 ENCOUNTER — Ambulatory Visit: Payer: 59 | Admitting: Gastroenterology

## 2023-12-12 ENCOUNTER — Other Ambulatory Visit: Payer: Self-pay | Admitting: Urology

## 2023-12-12 DIAGNOSIS — C642 Malignant neoplasm of left kidney, except renal pelvis: Secondary | ICD-10-CM

## 2023-12-24 ENCOUNTER — Other Ambulatory Visit: Payer: Self-pay | Admitting: Gastroenterology

## 2024-01-25 ENCOUNTER — Encounter: Payer: Self-pay | Admitting: Urology

## 2024-01-29 ENCOUNTER — Inpatient Hospital Stay: Admission: RE | Admit: 2024-01-29 | Source: Ambulatory Visit

## 2024-02-03 ENCOUNTER — Ambulatory Visit: Admitting: Gastroenterology

## 2024-03-04 ENCOUNTER — Ambulatory Visit
Admission: RE | Admit: 2024-03-04 | Discharge: 2024-03-04 | Disposition: A | Discharge status: Home / Self Care | Source: Ambulatory Visit | Attending: Urology | Admitting: Urology

## 2024-03-04 ENCOUNTER — Other Ambulatory Visit: Payer: Self-pay | Admitting: Urology

## 2024-03-04 DIAGNOSIS — C642 Malignant neoplasm of left kidney, except renal pelvis: Secondary | ICD-10-CM

## 2024-03-22 ENCOUNTER — Ambulatory Visit (HOSPITAL_BASED_OUTPATIENT_CLINIC_OR_DEPARTMENT_OTHER): Admission: EM | Admit: 2024-03-22 | Discharge: 2024-03-22 | Disposition: A | Discharge status: Home / Self Care

## 2024-03-22 ENCOUNTER — Other Ambulatory Visit (HOSPITAL_BASED_OUTPATIENT_CLINIC_OR_DEPARTMENT_OTHER): Payer: Self-pay

## 2024-03-22 ENCOUNTER — Encounter (HOSPITAL_BASED_OUTPATIENT_CLINIC_OR_DEPARTMENT_OTHER): Payer: Self-pay | Admitting: Emergency Medicine

## 2024-03-22 DIAGNOSIS — M2550 Pain in unspecified joint: Secondary | ICD-10-CM | POA: Diagnosis not present

## 2024-03-22 DIAGNOSIS — W57XXXA Bitten or stung by nonvenomous insect and other nonvenomous arthropods, initial encounter: Secondary | ICD-10-CM | POA: Diagnosis not present

## 2024-03-22 HISTORY — DX: Arthropathic psoriasis, unspecified: L40.50

## 2024-03-22 HISTORY — DX: Psoriasis, unspecified: L40.9

## 2024-03-22 MED ORDER — DOXYCYCLINE HYCLATE 100 MG PO CAPS
100.0000 mg | ORAL_CAPSULE | Freq: Two times a day (BID) | ORAL | 0 refills | Status: AC
  Filled 2024-03-22: qty 28, 14d supply, fill #0

## 2024-03-22 NOTE — ED Provider Notes (Signed)
 [facility]    CSN: [id number] Arrival date & time: 03/22/24  1348      History   Chief Complaint Chief Complaint  Patient presents with   Leg Pain    HPI Bruce Douglas is a 63 y.o. male.   Patient reports acute onset of bodyaches that started in the last 1 to 2 weeks.  He has found 2 different ticks on him but they were still crawling.  Neither tick was attached.  He had significant body aches approximately 4 to 6 weeks ago associated with the medication and when he stopped the medication the body aches stopped.  He is now on Rexulti and is worried that possibly Rexulti was causing it but he has basically weaned off of the Rexulti and no change in the body aches.  He does have psoriatic arthritis.  The body aches are at his hips his back his knees his legs.  It hurts to roll over in bed.  It hurts to get in and out of a car.  Sometimes it hurts to walk.  He does see primary care for management of his psoriatic arthritis.  He and his wife are concerned that he might have early tick fever, Lyme disease or something causing all of this arthritic pain.   Leg Pain Associated symptoms: no back pain and no fever     Past Medical History:  Diagnosis Date   Anxiety    Arthritis    hands   Asthma 06/04/2020   Cancer (HCC)    Chronic kidney disease    Depression    GERD (gastroesophageal reflux disease)    History of kidney stones    Hypertension    Hypothyroidism    Psoriasis    Psoriatic arthritis [facility])     Patient Active Problem List   Diagnosis Date Noted   Malnutrition of moderate degree 07/03/2020   Small bowel obstruction (HCC) 06/25/2020   Renal mass 06/20/2020    Past Surgical History:  Procedure Laterality Date   COLONOSCOPY  07/17/2021   [facility], [facility], hx of polyps, first colonoscopy ? when or where   HEMORRHOID SURGERY  1991   POLYPECTOMY     ROBOT ASSISTED LAPAROSCOPIC NEPHRECTOMY Left 06/20/2020   Procedure: XI ROBOTIC ASSISTED LAPAROSCOPIC  RADICAL NEPHRECTOMY;  Surgeon: Bruce Homans, MD;  Location: [facility];  Service: Urology;  Laterality: Left;   UPPER GASTROINTESTINAL ENDOSCOPY  07/17/2021   [facility]       Home Medications    Prior to Admission medications   Medication Sig Start Date End Date Taking? Authorizing Provider  albuterol  (VENTOLIN  HFA) 108 (90 Base) MCG/ACT inhaler Inhale 2 puffs into the lungs every 4 (four) hours as needed. 06/03/20  Yes [provider]  amLODipine  (NORVASC ) 5 MG tablet Take 5 mg by mouth at bedtime.  05/20/20  Yes [provider]  atenolol  (TENORMIN ) 25 MG tablet Take 25 mg by mouth at bedtime.  04/21/20  Yes [provider]  buPROPion  (WELLBUTRIN  XL) 300 MG 24 hr tablet Take 300 mg by mouth at bedtime.  05/25/20  Yes [provider]  cyclobenzaprine (FLEXERIL) 10 MG tablet Take 10 mg by mouth See admin instructions. Take 1 tablet (10 mg) by mouth scheduled at bedtime, may take an additional dose during the day if needed for back exertion. 04/21/20  Yes [provider]  doxycycline (VIBRAMYCIN) 100 MG capsule Take 1 capsule (100 mg total) by mouth 2 (two) times daily for 14 days.  03/22/24 04/05/24 Yes Guss Legacy, FNP  famotidine  (PEPCID ) 40 MG tablet TAKE 1 TABLET BY MOUTH TWICE A DAY 12/26/23  Yes Bruce Pila, MD  levothyroxine  (SYNTHROID ) 137 MCG tablet Take 137 mcg by mouth at bedtime.  05/06/20  Yes [provider]  OTEZLA 30 MG TABS Take 1 tablet by mouth 2 (two) times daily. 02/06/24  Yes [provider]  QUEtiapine  (SEROQUEL ) 100 MG tablet Take 100 mg by mouth at bedtime. 05/03/20  Yes [provider]  REXULTI 0.25 MG TABS tablet Take 0.25 mg by mouth daily. 11/30/23  Yes [provider]  sodium bicarbonate 650 MG tablet Take 650 mg by mouth daily. 06/11/21  Yes [provider]  tamsulosin (FLOMAX) 0.4 MG CAPS capsule Take 0.4 mg by mouth daily. 06/14/21  Yes [provider]  omeprazole   (PRILOSEC) 40 MG capsule TAKE 1 CAPSULE (40 MG TOTAL) BY MOUTH DAILY. 12/26/23   Bruce Pila, MD    Family History Family History  Problem Relation Age of Onset   Ovarian cancer Mother    Prostate cancer Father    Colon cancer Neg Hx    Rectal cancer Neg Hx     Social History Social History   Tobacco Use   Smoking status: Former    Current packs/day: 0.25    Average packs/day: 0.3 packs/day for 15.0 years (3.8 ttl pk-yrs)    Types: Cigarettes   Smokeless tobacco: Never  Vaping Use   Vaping status: Never Used  Substance Use Topics   Alcohol  use: Not Currently   Drug use: Not Currently    Comment: recovered drug and ETOH     Allergies   Patient has no known allergies.   Review of Systems Review of Systems  Constitutional:  Negative for chills and fever.  HENT:  Negative for ear pain and sore throat.   Eyes:  Negative for pain and visual disturbance.  Respiratory:  Negative for cough.   Cardiovascular:  Negative for chest pain and palpitations.  Gastrointestinal:  Negative for abdominal pain, constipation, diarrhea, nausea and vomiting.  Genitourinary:  Negative for dysuria and hematuria.  Musculoskeletal:  Positive for arthralgias. Negative for back pain.  Skin:  Negative for color change and rash.  Neurological:  Negative for seizures and syncope.  All other systems reviewed and are negative.    Physical Exam Triage Vital Signs ED Triage Vitals  Encounter Vitals Group     BP 03/22/24 1400 105/72     Girls Systolic BP Percentile --      Girls Diastolic BP Percentile --      Boys Systolic BP Percentile --      Boys Diastolic BP Percentile --      Pulse Rate 03/22/24 1400 84     Resp 03/22/24 1400 18     Temp 03/22/24 1400 98.3 F (36.8 C)     Temp Source 03/22/24 1400 Oral     SpO2 03/22/24 1400 95 %     Weight --      Height --      Head Circumference --      Peak Flow --      Pain Score 03/22/24 1357 3     Pain Loc --      Pain Education --       Exclude from Growth Chart --    No data found.  Updated Vital Signs BP 105/72 (BP Location: Right Arm)   Pulse 84   Temp 98.3 F (36.8 C) (Oral)  Resp 18   SpO2 95%   Visual Acuity Right Eye Distance:   Left Eye Distance:   Bilateral Distance:    Right Eye Near:   Left Eye Near:    Bilateral Near:     Physical Exam Vitals and nursing note reviewed.  Constitutional:      General: He is not in acute distress.    Appearance: He is well-developed. He is not ill-appearing or toxic-appearing.  HENT:     Head: Normocephalic and atraumatic.     Right Ear: Hearing, tympanic membrane, ear canal and external ear normal.     Left Ear: Hearing, tympanic membrane, ear canal and external ear normal.     Nose: No congestion or rhinorrhea.     Right Sinus: No maxillary sinus tenderness or frontal sinus tenderness.     Left Sinus: No maxillary sinus tenderness or frontal sinus tenderness.     Mouth/Throat:     Lips: Pink.     Mouth: Mucous membranes are moist.     Pharynx: Uvula midline. No oropharyngeal exudate or posterior oropharyngeal erythema.     Tonsils: No tonsillar exudate.   Eyes:     Conjunctiva/sclera: Conjunctivae normal.     Pupils: Pupils are equal, round, and reactive to light.    Cardiovascular:     Rate and Rhythm: Normal rate and regular rhythm.     Heart sounds: S1 normal and S2 normal. No murmur heard. Pulmonary:     Effort: Pulmonary effort is normal. No respiratory distress.     Breath sounds: Normal breath sounds. No decreased breath sounds, wheezing, rhonchi or rales.  Abdominal:     General: Bowel sounds are normal.     Palpations: Abdomen is soft.     Tenderness: There is no abdominal tenderness.   Musculoskeletal:        General: No swelling.     Cervical back: Neck supple.     Comments: Full range of motion of arms and legs but reports pain in his hips with moving from lying to sitting and pain in his legs with walking or bending his legs to sit  up and get out of a chair.  No specific swelling noted in any joints of the hands or legs.   Lymphadenopathy:     Head:     Right side of head: No submental, submandibular, tonsillar, preauricular or posterior auricular adenopathy.     Left side of head: No submental, submandibular, tonsillar, preauricular or posterior auricular adenopathy.     Cervical: No cervical adenopathy.     Right cervical: No superficial cervical adenopathy.    Left cervical: No superficial cervical adenopathy.   Skin:    General: Skin is warm and dry.     Capillary Refill: Capillary refill takes less than 2 seconds.     Findings: No rash.     Comments: No rashes noted anywhere on his body.  Full skin exam done and unable to find any evidence of a currently attached or implanted tick.   Neurological:     Mental Status: He is alert and oriented to person, place, and time.   Psychiatric:        Mood and Affect: Mood normal.      UC Treatments / Results  Labs (all labs ordered are listed, but only abnormal results are displayed) Labs Reviewed  LYME DISEASE SEROLOGY W/REFLEX  SPOTTED FEVER GROUP ANTIBODIES    EKG   Radiology No results found.  Procedures Procedures (including  critical care time)  Medications Ordered in UC Medications - No data to display  Initial Impression / Assessment and Plan / UC Course  I have reviewed the triage vital signs and the nursing notes.  Pertinent labs & imaging results that were available during my care of the patient were reviewed by me and considered in my medical decision making (see chart for details).  Plan of Care: Arthralgias of multiple sites with possible tick bite in the last 2 weeks: Lyme and [facility] spotted fever titers pending.  Will treat with doxycycline 100 mg twice daily for 14 days.  If no improvement in symptoms at all in 5 to 7 days and if his titers are negative, then he should stop the doxycycline.  If his arthralgias persist and it  does not seem to be tick related, he needs to see primary care for further workup of other causes of arthralgias including possibly polymyalgia rheumatica.  He may also need steroids for his condition.  Follow-up here if needed but next follow-up is most likely with primary care.  I reviewed the plan of care with the patient and/or the patient's guardian.  The patient and/or guardian had time to ask questions and acknowledged that the questions were answered.  I provided instruction on symptoms or reasons to return here or to go to an ER, if symptoms/condition did not improve, worsened or if new symptoms occurred.  Final Clinical Impressions(s) / UC Diagnoses   Final diagnoses:  Arthralgia of multiple sites  Tick bite, unspecified site, initial encounter     Discharge Instructions      Patient has acute onset arthralgia in multiple sites and had ticks on his body more than once in the last 2 weeks.  The ticks that he found were still moving and not attached.  Will draw blood for [facility] spotted fever and Lyme.  Will treat with doxycycline 100 mg twice daily for 14 days.  Will adjust the plan of care, if needed based on how he responds to the antibiotic and based on his blood work.  If after 5 to 7 days, he has no improvement in his arthritic type pain and his blood work is negative then he may need to stop the doxycycline.  If the antibiotic does not help his arthritic pain, he will need to see primary care and get additional workup.  This could be polymyalgia rheumatica or some other acute arthritic condition.  He might need additional blood work or steroids.  Follow-up here as needed.     ED Prescriptions     Medication Sig Dispense Auth. Provider   doxycycline (VIBRAMYCIN) 100 MG capsule Take 1 capsule (100 mg total) by mouth 2 (two) times daily for 14 days. 28 capsule Guss Legacy, FNP      PDMP not reviewed this encounter.   Guss Legacy, FNP 03/22/24 [phone]

## 2024-03-22 NOTE — ED Triage Notes (Signed)
 Pt reports his body hurts mainly his legs he is unable to pick up his legs, recently was placed on new antidepressant currently reducing the medication but he has had no improvement. Pt reports he has found ticks on him but none has bite him.

## 2024-03-22 NOTE — Discharge Instructions (Addendum)
 Patient has acute onset arthralgia in multiple sites and had ticks on his body more than once in the last 2 weeks.  The ticks that he found were still moving and not attached.  Will draw blood for [facility] spotted fever and Lyme.  Will treat with doxycycline 100 mg twice daily for 14 days.  Will adjust the plan of care, if needed based on how he responds to the antibiotic and based on his blood work.  If after 5 to 7 days, he has no improvement in his arthritic type pain and his blood work is negative then he may need to stop the doxycycline.  If the antibiotic does not help his arthritic pain, he will need to see primary care and get additional workup.  This could be polymyalgia rheumatica or some other acute arthritic condition.  He might need additional blood work or steroids.  Follow-up here as needed.

## 2024-03-26 LAB — SPOTTED FEVER GROUP ANTIBODIES
Spotted Fever Group IgG: <1:64 {titer}
Spotted Fever Group IgM: <1:64 {titer}

## 2024-03-26 LAB — LYME DISEASE SEROLOGY W/REFLEX: Lyme Total Antibody EIA: NEGATIVE

## 2024-03-27 ENCOUNTER — Ambulatory Visit (HOSPITAL_BASED_OUTPATIENT_CLINIC_OR_DEPARTMENT_OTHER): Payer: Self-pay | Admitting: Family Medicine

## 2024-03-27 NOTE — Progress Notes (Signed)
 [facility] spotted fever and Lyme titers are both negative.  Patient had antibiotics and has been advised to complete the antibiotics as titers sometimes do not flip positive quickly.  No additional treatment is needed.  Patient updated via voicemail message.

## 2024-03-31 ENCOUNTER — Other Ambulatory Visit: Payer: Self-pay | Admitting: Gastroenterology

## 2024-04-18 ENCOUNTER — Ambulatory Visit (INDEPENDENT_AMBULATORY_CARE_PROVIDER_SITE_OTHER): Admitting: Gastroenterology

## 2024-04-18 ENCOUNTER — Encounter: Payer: Self-pay | Admitting: Gastroenterology

## 2024-04-18 VITALS — BP 110/66 | HR 63 | Ht 70.0 in | Wt 149.0 lb

## 2024-04-18 DIAGNOSIS — K449 Diaphragmatic hernia without obstruction or gangrene: Secondary | ICD-10-CM | POA: Diagnosis not present

## 2024-04-18 DIAGNOSIS — K219 Gastro-esophageal reflux disease without esophagitis: Secondary | ICD-10-CM

## 2024-04-18 DIAGNOSIS — Z860100 Personal history of colon polyps, unspecified: Secondary | ICD-10-CM | POA: Diagnosis not present

## 2024-04-18 NOTE — Patient Instructions (Signed)
 _______________________________________________________  If your blood pressure at your visit was 140/90 or greater, please contact your primary care physician to follow up on this.  _______________________________________________________  If you are age 63 or older, your body mass index should be between 23-30. Your Body mass index is 21.38 kg/m. If this is out of the aforementioned range listed, please consider follow up with your Primary Care Provider.  If you are age 40 or younger, your body mass index should be between 19-25. Your Body mass index is 21.38 kg/m. If this is out of the aformentioned range listed, please consider follow up with your Primary Care Provider.   ________________________________________________________  The [facility] providers would like to encourage you to use MYCHART to communicate with providers for non-urgent requests or questions.  Due to long hold times on the telephone, sending your provider a message by [facility] may be a faster and more efficient way to get a response.  Please allow 48 business hours for a response.  Please remember that this is for non-urgent requests.  _______________________________________________________  Decrease omeprazole  40mg  to every other day  Thank you,  Dr. Lynnie Bring

## 2024-04-18 NOTE — Progress Notes (Signed)
 Chief Complaint: FU  Referring Provider:  Clemmie Nest, MD      ASSESSMENT AND PLAN;   #1. GERD with small HH  #3. H/O polyps, neg colon 07/2021, rpt 10 yrs.  Plan -Continue omeprazole  40 every other day -Nonpharmacologic means of reflux control. -Rpt colon October 2032.  Earlier, if with any new problems -D/W wife.  They will call us  in case of any GI problems   HPI:    Bruce Douglas is a 63 y.o. male  With H/O RCC s/p L nephrectomy, anxiety/asthma/GERD, HTN, hypothyroidism, CKD3(only R remaining kidney), on upper dentures, recent Dx with PMR on steroids Accompanied by his wife History of Present Illness For FU  He experiences heartburn approximately once a month, which he describes as not too bothersome. He manages his symptoms with omeprazole  40 mg every other night and famotidine  in the morning and every other night. No odynophagia or dysphagia.  He has a history of kidney cancer, for which he underwent surgery three years ago, leaving him with a single kidney. He is currently at stage 3B chronic kidney disease. He is scheduled to see his nephrologist in a couple of weeks.  He has been diagnosed with polymyalgia rheumatica and is currently on prednisone 30 mg twice a day. He experiences significant side effects from the medication, including insomnia and jitteriness. His caregiver notes that his symptoms were severe prior to starting treatment, causing significant pain. His inflammatory markers, including ESR, were notably elevated.  He has a history of smoking, which he quit following his cancer diagnosis. No current tobacco use, including chewing tobacco.  No significant issues with diarrhea currently, and his last colonoscopy in 2022 was normal. He was recently tested for Lyme disease, which was negative.  No eye problems or cataracts, although he acknowledges needing glasses.    From previous notes: Underwent robotic left radical nephrectomy 06/2020 with  postop course complicated by PSBO managed conservatively with NG tube, IV fluids.  C/O GERD x 40 yrs on omeprazole  (x 20 years).  May have had remote EGD on 20 years ago.  No odynophagia or dysphagia.  Also wants to wean himself off omeprazole .  He is currently taking 40 mg once a day at night.  No nonsteroidals.  Does drink significant coffee.   Wt Readings from Last 3 Encounters:  04/18/24 149 lb (67.6 kg)  07/17/21 147 lb (66.7 kg)  07/06/21 147 lb 8 oz (66.9 kg)   History of alcohol  abuse and cocaine use.  Last time 06/2020.  None since.    Previous GI work-up: EGD 07/17/2021: Small HH.  Negative gastric, duodenal biopsies Colonoscopy 07/2021: neg. negative random colon biopsies for microscopic colitis.  Repeat in 10 years Colonoscopy January 2017 at [place], [place]: Small ascending colon polyp s/p polypectomy, biopsies SSA.   CT Abdo/pelvis 06/25/2020 -Distal partial or developing complete small bowel obstruction. -Interval left nephrectomy. Extensive body wall subcutaneous gas and small free intraperitoneal gas is likely postsurgical in nature. -Aortic Atherosclerosis (ICD10-I70.0).  MRI Abdo 04/2020 1. Findings are consistent with enlarging L renal cell carcinoma. 2. No evidence of metastatic disease in the abdomen. No evidence of renal vein invasion or lymphadenopathy. Past Medical History:  Diagnosis Date   Anxiety    Arthritis    hands   Asthma 06/04/2020   Cancer (HCC)    Chronic kidney disease    Depression    GERD (gastroesophageal reflux disease)    History of kidney stones    Hypertension  Hypothyroidism    Psoriasis    Psoriatic arthritis [facility])     Past Surgical History:  Procedure Laterality Date   COLONOSCOPY  07/17/2021   [facility]nie Bring, [facility], hx of polyps, first colonoscopy ? when or where   HEMORRHOID SURGERY  1991   POLYPECTOMY     ROBOT ASSISTED LAPAROSCOPIC NEPHRECTOMY Left 06/20/2020   Procedure: XI ROBOTIC ASSISTED LAPAROSCOPIC RADICAL  NEPHRECTOMY;  Surgeon: Devere Lonni Righter, MD;  Location: [facility];  Service: Urology;  Laterality: Left;   UPPER GASTROINTESTINAL ENDOSCOPY  07/17/2021   Lynnie Bring    Family History  Problem Relation Age of Onset   Ovarian cancer Mother    Prostate cancer Father    Colon cancer Neg Hx    Rectal cancer Neg Hx     Social History   Tobacco Use   Smoking status: Former    Current packs/day: 0.25    Average packs/day: 0.3 packs/day for 15.0 years (3.8 ttl pk-yrs)    Types: Cigarettes   Smokeless tobacco: Never  Vaping Use   Vaping status: Never Used  Substance Use Topics   Alcohol  use: Not Currently   Drug use: Not Currently    Comment: recovered drug and ETOH    Current Outpatient Medications  Medication Sig Dispense Refill   albuterol  (VENTOLIN  HFA) 108 (90 Base) MCG/ACT inhaler Inhale 2 puffs into the lungs every 4 (four) hours as needed.     amLODipine  (NORVASC ) 5 MG tablet Take 5 mg by mouth at bedtime.      atenolol  (TENORMIN ) 25 MG tablet Take 25 mg by mouth at bedtime.      buPROPion  (WELLBUTRIN  XL) 300 MG 24 hr tablet Take 300 mg by mouth at bedtime.      cyclobenzaprine (FLEXERIL) 10 MG tablet Take 10 mg by mouth See admin instructions. Take 1 tablet (10 mg) by mouth scheduled at bedtime, may take an additional dose during the day if needed for back exertion.     famotidine  (PEPCID ) 40 MG tablet TAKE 1 TABLET BY MOUTH TWICE A DAY 180 tablet 0   levothyroxine  (SYNTHROID ) 137 MCG tablet Take 137 mcg by mouth at bedtime.      omeprazole  (PRILOSEC) 40 MG capsule TAKE 1 CAPSULE (40 MG TOTAL) BY MOUTH DAILY. 90 capsule 0   OTEZLA 30 MG TABS Take 1 tablet by mouth 2 (two) times daily.     predniSONE (DELTASONE) 10 MG tablet Take 30 mg by mouth 2 (two) times daily.     QUEtiapine  (SEROQUEL ) 100 MG tablet Take 100 mg by mouth at bedtime.     sodium bicarbonate 650 MG tablet Take 650 mg by mouth daily.     tamsulosin (FLOMAX) 0.4 MG CAPS capsule Take 0.4 mg by mouth  daily.     REXULTI 0.25 MG TABS tablet Take 0.25 mg by mouth daily. (Patient not taking: Reported on 04/18/2024)     No current facility-administered medications for this visit.    No Known Allergies  Review of Systems:  Constitutional: Denies fever, chills, diaphoresis, appetite change and fatigue.  HEENT: Denies photophobia, eye pain, redness, hearing loss, ear pain, congestion, sore throat, rhinorrhea, sneezing, mouth sores, neck pain, neck stiffness and tinnitus.   Respiratory: Denies SOB, DOE, cough, chest tightness,  and wheezing.   Cardiovascular: Denies chest pain, palpitations and leg swelling.  Genitourinary: Denies dysuria, urgency, frequency, hematuria, flank pain and difficulty urinating.  Musculoskeletal: Denies myalgias, back pain, joint swelling, arthralgias and gait problem.  Skin: No rash.  Neurological: Denies dizziness, seizures, syncope, weakness, light-headedness, numbness and headaches.  Hematological: Denies adenopathy. Easy bruising, personal or family bleeding history  Psychiatric/Behavioral: Has anxiety/ depression     Physical Exam:    BP 110/66   Pulse 63   Ht 5' 10 (1.778 m)   Wt 149 lb (67.6 kg)   BMI 21.38 kg/m  Wt Readings from Last 3 Encounters:  04/18/24 149 lb (67.6 kg)  07/17/21 147 lb (66.7 kg)  07/06/21 147 lb 8 oz (66.9 kg)   Constitutional: Thin built, in no acute distress. Psychiatric: Normal mood and affect. Behavior is normal. HEENT: Pupils normal.  Conjunctivae are normal. No scleral icterus.  Cardiovascular: Normal rate, regular rhythm. No edema Pulmonary/chest: Effort normal and breath sounds-decreased.  No wheezing, rales or rhonchi. Abdominal: Soft, nondistended. Nontender. Bowel sounds active throughout. There are no masses palpable. No hepatomegaly. Rectal: Deferred Neurological: Alert and oriented to person place and time. Skin: Skin is warm and dry. No rashes noted.  Data Reviewed: I have personally reviewed following  labs and imaging studies  CBC:    Latest Ref Rng & Units 07/02/2020    4:22 AM 06/26/2020    6:03 AM 06/25/2020    8:29 PM  CBC  WBC 4.0 - 10.5 K/uL 11.9   4.5   Hemoglobin 13.0 - 17.0 g/dL 86.6  87.5  86.5   Hematocrit 39.0 - 52.0 % 38.9  37.1  37.2   Platelets 150 - 400 K/uL 390   271     CMP:    Latest Ref Rng & Units 07/03/2020    5:37 AM 07/02/2020    4:22 AM 07/01/2020    5:12 AM  CMP  Glucose 70 - 99 mg/dL 896  894  887   BUN 6 - 20 mg/dL 15  13  19    Creatinine 0.61 - 1.24 mg/dL 8.55  8.66  8.60   Sodium 135 - 145 mmol/L 133  134  130   Potassium 3.5 - 5.1 mmol/L 4.1  4.4  4.1   Chloride 98 - 111 mmol/L 99  101  99   CO2 22 - 32 mmol/L 23  25  22    Calcium 8.9 - 10.3 mg/dL 9.1  8.9  8.8   Total Protein 6.5 - 8.1 g/dL  6.5  6.7   Total Bilirubin 0.3 - 1.2 mg/dL  0.2  0.4   Alkaline Phos 38 - 126 U/L  49  56   AST 15 - 41 U/L  17  22   ALT 0 - 44 U/L  21  24       Anselm Bring, MD 04/18/2024, 4:30 PM  Cc: Clemmie Nest, MD

## 2024-06-30 ENCOUNTER — Other Ambulatory Visit: Payer: Self-pay | Admitting: Gastroenterology

## 2024-07-02 ENCOUNTER — Other Ambulatory Visit (HOSPITAL_COMMUNITY): Payer: Self-pay

## 2024-07-02 DIAGNOSIS — M353 Polymyalgia rheumatica: Secondary | ICD-10-CM

## 2024-07-02 DIAGNOSIS — M316 Other giant cell arteritis: Secondary | ICD-10-CM

## 2024-07-02 DIAGNOSIS — M791 Myalgia, unspecified site: Secondary | ICD-10-CM

## 2024-07-02 DIAGNOSIS — R490 Dysphonia: Secondary | ICD-10-CM

## 2024-07-09 ENCOUNTER — Ambulatory Visit (HOSPITAL_COMMUNITY): Admission: RE | Admit: 2024-07-09

## 2024-07-09 ENCOUNTER — Ambulatory Visit (HOSPITAL_COMMUNITY)
Admission: RE | Admit: 2024-07-09 | Discharge: 2024-07-09 | Disposition: A | Discharge status: Home / Self Care | Source: Ambulatory Visit

## 2024-07-09 ENCOUNTER — Encounter (HOSPITAL_COMMUNITY): Payer: Self-pay

## 2024-07-09 DIAGNOSIS — M791 Myalgia, unspecified site: Secondary | ICD-10-CM

## 2024-07-09 DIAGNOSIS — R490 Dysphonia: Secondary | ICD-10-CM

## 2024-07-09 DIAGNOSIS — M316 Other giant cell arteritis: Secondary | ICD-10-CM

## 2024-07-09 DIAGNOSIS — M353 Polymyalgia rheumatica: Secondary | ICD-10-CM

## 2024-07-09 MED ORDER — GADOBUTROL 1 MMOL/ML IV SOLN
6.0000 mL | Freq: Once | INTRAVENOUS | Status: AC | PRN
Start: 2024-07-09 — End: 2024-07-09
  Administered 2024-07-09: 6 mL via INTRAVENOUS

## 2024-07-13 ENCOUNTER — Ambulatory Visit (HOSPITAL_COMMUNITY)
Admission: RE | Admit: 2024-07-13 | Discharge: 2024-07-13 | Disposition: A | Discharge status: Home / Self Care | Source: Ambulatory Visit

## 2024-07-13 DIAGNOSIS — M791 Myalgia, unspecified site: Secondary | ICD-10-CM | POA: Diagnosis present

## 2024-07-13 DIAGNOSIS — M353 Polymyalgia rheumatica: Secondary | ICD-10-CM | POA: Insufficient documentation

## 2024-07-13 DIAGNOSIS — M316 Other giant cell arteritis: Secondary | ICD-10-CM | POA: Insufficient documentation

## 2024-07-13 DIAGNOSIS — R490 Dysphonia: Secondary | ICD-10-CM | POA: Diagnosis present

## 2024-07-13 MED ORDER — GADOBUTROL 1 MMOL/ML IV SOLN
6.0000 mL | Freq: Once | INTRAVENOUS | Status: AC | PRN
  Administered 2024-07-13: 6 mL via INTRAVENOUS

## 2024-07-14 ENCOUNTER — Ambulatory Visit (HOSPITAL_COMMUNITY)

## 2024-07-16 ENCOUNTER — Ambulatory Visit (HOSPITAL_COMMUNITY)

## 2024-09-27 ENCOUNTER — Other Ambulatory Visit: Payer: Self-pay | Admitting: Gastroenterology
